# Patient Record
Sex: Male | Born: 1955 | Race: White | Hispanic: No | Marital: Single | Smoking: Former smoker
Health system: Southern US, Community
[De-identification: ages and names within clinical notes are randomized; demographics above are authoritative.]

## PROBLEM LIST (undated history)

## (undated) DIAGNOSIS — F419 Anxiety disorder, unspecified: Secondary | ICD-10-CM

## (undated) DIAGNOSIS — J869 Pyothorax without fistula: Secondary | ICD-10-CM

## (undated) HISTORY — PX: DENTAL SURGERY: SHX609

---

## 2000-04-15 ENCOUNTER — Encounter: Payer: Self-pay | Admitting: Emergency Medicine

## 2000-04-15 ENCOUNTER — Emergency Department (HOSPITAL_COMMUNITY): Admission: EM | Admit: 2000-04-15 | Discharge: 2000-04-15 | Payer: Self-pay | Admitting: Emergency Medicine

## 2011-11-02 ENCOUNTER — Encounter (HOSPITAL_COMMUNITY): Payer: Self-pay

## 2011-11-02 ENCOUNTER — Ambulatory Visit (INDEPENDENT_AMBULATORY_CARE_PROVIDER_SITE_OTHER): Payer: BC Managed Care – PPO | Admitting: Emergency Medicine

## 2011-11-02 ENCOUNTER — Emergency Department (HOSPITAL_COMMUNITY)
Admission: EM | Admit: 2011-11-02 | Discharge: 2011-11-02 | Disposition: A | Discharge status: Home / Self Care | Payer: BC Managed Care – PPO | Attending: Emergency Medicine | Admitting: Emergency Medicine

## 2011-11-02 VITALS — BP 157/90 | HR 87 | Temp 98.2°F | Resp 18 | Ht 69.5 in | Wt 182.8 lb

## 2011-11-02 DIAGNOSIS — F419 Anxiety disorder, unspecified: Secondary | ICD-10-CM

## 2011-11-02 DIAGNOSIS — Z87891 Personal history of nicotine dependence: Secondary | ICD-10-CM | POA: Insufficient documentation

## 2011-11-02 DIAGNOSIS — F411 Generalized anxiety disorder: Secondary | ICD-10-CM

## 2011-11-02 DIAGNOSIS — R079 Chest pain, unspecified: Secondary | ICD-10-CM

## 2011-11-02 MED ORDER — LORAZEPAM 1 MG PO TABS: 1.0000 mg | ORAL_TABLET | Freq: Three times a day (TID) | ORAL | Status: DC | PRN

## 2011-11-02 MED ORDER — ALPRAZOLAM 0.25 MG PO TABS
0.2500 mg | ORAL_TABLET | Freq: Once | ORAL | Status: AC
  Administered 2011-11-02: 0.25 mg via ORAL

## 2011-11-02 MED ORDER — ASPIRIN 81 MG PO CHEW: 324.0000 mg | CHEWABLE_TABLET | Freq: Once | ORAL | Status: DC

## 2011-11-02 NOTE — ED Provider Notes (Signed)
History     CSN: [id number]  Arrival date & time 11/02/11  1556   First MD Initiated Contact with Patient 11/02/11 1557      Chief Complaint  Patient presents with  . Chest Pain  . Anxiety    (Consider location/radiation/quality/duration/timing/severity/associated sxs/prior treatment) Patient is a 56 y.o. male presenting with chest pain. The history is provided by the patient and the EMS personnel.  Chest Pain The chest pain began more than 2 weeks ago. Chest pain occurs intermittently. The chest pain is resolved. The pain is associated with stress (anxiety). At its most intense, the pain is at 5/10. The pain is currently at 0/10. The severity of the pain is moderate. The quality of the pain is described as pressure-like. The pain does not radiate. Chest pain is worsened by stress. Pertinent negatives for primary symptoms include no fever, no fatigue, no syncope, no shortness of breath, no cough, no wheezing, no palpitations, no abdominal pain, no nausea, no vomiting, no dizziness and no altered mental status. Treatments tried: ativan. Risk factors include stress.     No past medical history on file.  No past surgical history on file.  No family history on file.  History  Substance Use Topics  . Smoking status: Former Games developer  . Smokeless tobacco: Not on file  . Alcohol Use: Not on file      Review of Systems  Constitutional: Negative for fever and fatigue.  Respiratory: Negative for cough, shortness of breath and wheezing.   Cardiovascular: Positive for chest pain. Negative for palpitations and syncope.  Gastrointestinal: Negative for nausea, vomiting, abdominal pain and diarrhea.  Neurological: Negative for dizziness and headaches.  Psychiatric/Behavioral: Negative for altered mental status.  All other systems reviewed and are negative.    Allergies  Review of patient's allergies indicates no known allergies.  Home Medications  No current outpatient prescriptions  on file.  BP 154/97  Pulse 66  Temp 97.9 F (36.6 C) (Oral)  Resp 18  SpO2 98%  Physical Exam  Nursing note and vitals reviewed. Constitutional: He is oriented to person, place, and time. He appears well-developed and well-nourished. No distress.  HENT:  Head: Normocephalic and atraumatic.  Eyes: Pupils are equal, round, and reactive to light.  Cardiovascular: Normal rate and normal heart sounds.   Pulmonary/Chest: Effort normal and breath sounds normal. No respiratory distress.  Abdominal: Soft. He exhibits no distension. There is no tenderness.  Musculoskeletal: Normal range of motion.  Neurological: He is alert and oriented to person, place, and time.  Skin: Skin is warm and dry.  Psychiatric: His mood appears anxious.    ED Course  Procedures (including critical care time)  Labs Reviewed - No data to display No results found.   Date: 11/02/2011  Rate: 70  Rhythm: normal sinus rhythm  QRS Axis: left  Intervals: normal  ST/T Wave abnormalities: normal  Conduction Disutrbances:none  Narrative Interpretation: NSR  Old EKG Reviewed: none available    1. Anxiety       MDM  4:06 PM Pt seen and examined. Pt sent from urgent care for subtle EKG changes associated with anxiety. Pt feels back to normal after ativan he was given there. Will repeat EKG as unable to see changes on this EKG. Feel that chest pain is more related to anxiety as it has now completely resolved. Pt also states that he has never had this pain during exertion in the last several weeks. Pt does have follow up tomorrow.  5:19 PM EKG here unremarkable. Pt continues to be chest pain free. Will discharge.        Daleen Bo, MD 11/03/11 0100

## 2011-11-02 NOTE — ED Notes (Signed)
To ED for eval of intermittent CP over the past cple weeks. Sent from [facility] for further eval. States he is going through a lot of stress and when anxious pain increases. 324mg  ASA given at [facility]

## 2011-11-02 NOTE — ED Notes (Signed)
Pt states he has had intermittent CP for 4 - 5 weeks.  He describes pain as starting in the left chest and "feels like it moves more left and then it goes away".  Pt states not always, but at times includes SOB and weakness.  [facility] stated EKG showed slight changes.

## 2011-11-02 NOTE — ED Provider Notes (Signed)
I saw and evaluated the patient, reviewed the resident's note and I agree with the findings and plan. 56 year old, male, presents emergency department complaining of intermittent left-sided chest pain.  That lasts 30 seconds to a minute.  He states that the symptoms occur when he is very anxious, as when he speaks on the telephone.  He states that he is in a lot of stress after he broke up with his girlfriend.  A few weeks ago.  He is asymptomatic now.  He was seen at an urgent care center, and there was a question about his EKG, so they sent him here for evaluation.  His evaluation in the emergency department is normal.  We will release him.  He already has an appointment for followup tomorrow.  Cheri Guppy, MD 11/02/11 [phone]

## 2011-11-02 NOTE — Progress Notes (Signed)
  Subjective:    Patient ID: Charles Dawson, male    DOB: Jun 21, 1955, 56 y.o.   MRN: [id number]  HPI approximately one month ago patient started having episodes of substernal chest discomfort associated with shortness of breath and diaphoresis. These spells all of short duration lasting only 30 seconds to a minute. Patient has been under a lot of stress recently over her breakup with his girlfriend. His wife died 2 years ago. He is nonsmoker but drinks 10-12 beers per day. He is on no medications at the present time    Review of Systems     Objective:   Physical Exam  Constitutional: He appears well-developed and well-nourished.  Eyes: Pupils are equal, round, and reactive to light.  Neck: No thyromegaly present.  Cardiovascular: Normal rate and regular rhythm.   Pulmonary/Chest: Effort normal and breath sounds normal.    EKG normal sinus rhythm versus 2 mm of ST depression at the J-point in 23 and aVF. There are minimal changes in the precordial      Assessment & Plan:  Has mild EKG changes. He has been under a large amount of stress recently. We'll go ahead and send him to the hospital for blood work he was given 0.25 Xanax to relax him a little bit and 4 baby aspirin. He was placed on O2 and on the monitor

## 2011-11-03 NOTE — ED Provider Notes (Signed)
I saw and evaluated the patient, reviewed the resident's note and I agree with the findings and plan.  Cheri Guppy, MD 11/03/11 [phone]

## 2011-11-05 ENCOUNTER — Other Ambulatory Visit: Payer: Self-pay | Admitting: Family Medicine

## 2011-11-05 ENCOUNTER — Ambulatory Visit
Admission: RE | Admit: 2011-11-05 | Discharge: 2011-11-05 | Disposition: A | Discharge status: Home / Self Care | Payer: BC Managed Care – PPO | Source: Ambulatory Visit | Attending: Family Medicine | Admitting: Family Medicine

## 2011-11-05 DIAGNOSIS — M545 Low back pain: Secondary | ICD-10-CM

## 2011-12-25 ENCOUNTER — Emergency Department (HOSPITAL_COMMUNITY): Payer: BC Managed Care – PPO

## 2011-12-25 ENCOUNTER — Encounter (HOSPITAL_COMMUNITY): Payer: Self-pay | Admitting: *Deleted

## 2011-12-25 ENCOUNTER — Inpatient Hospital Stay (HOSPITAL_COMMUNITY)
Admission: EM | Admit: 2011-12-25 | Discharge: 2012-01-02 | DRG: 539 | Disposition: A | Discharge status: Home / Self Care | Payer: BC Managed Care – PPO | Attending: Internal Medicine | Admitting: Internal Medicine

## 2011-12-25 DIAGNOSIS — D72829 Elevated white blood cell count, unspecified: Secondary | ICD-10-CM | POA: Diagnosis present

## 2011-12-25 DIAGNOSIS — J90 Pleural effusion, not elsewhere classified: Secondary | ICD-10-CM | POA: Diagnosis present

## 2011-12-25 DIAGNOSIS — J449 Chronic obstructive pulmonary disease, unspecified: Secondary | ICD-10-CM | POA: Diagnosis present

## 2011-12-25 DIAGNOSIS — R079 Chest pain, unspecified: Secondary | ICD-10-CM | POA: Diagnosis present

## 2011-12-25 DIAGNOSIS — J432 Centrilobular emphysema: Secondary | ICD-10-CM

## 2011-12-25 DIAGNOSIS — D649 Anemia, unspecified: Secondary | ICD-10-CM | POA: Diagnosis present

## 2011-12-25 DIAGNOSIS — F411 Generalized anxiety disorder: Secondary | ICD-10-CM | POA: Diagnosis present

## 2011-12-25 DIAGNOSIS — Z79899 Other long term (current) drug therapy: Secondary | ICD-10-CM

## 2011-12-25 DIAGNOSIS — J189 Pneumonia, unspecified organism: Secondary | ICD-10-CM | POA: Diagnosis present

## 2011-12-25 DIAGNOSIS — J4489 Other specified chronic obstructive pulmonary disease: Secondary | ICD-10-CM | POA: Diagnosis present

## 2011-12-25 DIAGNOSIS — M545 Low back pain, unspecified: Secondary | ICD-10-CM | POA: Diagnosis present

## 2011-12-25 DIAGNOSIS — G8929 Other chronic pain: Secondary | ICD-10-CM | POA: Diagnosis present

## 2011-12-25 DIAGNOSIS — J869 Pyothorax without fistula: Secondary | ICD-10-CM | POA: Diagnosis present

## 2011-12-25 DIAGNOSIS — Z87891 Personal history of nicotine dependence: Secondary | ICD-10-CM

## 2011-12-25 HISTORY — DX: Anxiety disorder, unspecified: F41.9

## 2011-12-25 HISTORY — DX: Pyothorax without fistula: J86.9

## 2011-12-25 LAB — CBC
HCT: 37.4 % — ABNORMAL LOW (ref 39.0–52.0)
Hemoglobin: 12.4 g/dL — ABNORMAL LOW (ref 13.0–17.0)
MCH: 32.7 pg (ref 26.0–34.0)
MCV: 98.7 fL (ref 78.0–100.0)
RBC: 3.79 MIL/uL — ABNORMAL LOW (ref 4.22–5.81)

## 2011-12-25 LAB — BASIC METABOLIC PANEL
CO2: 25 mEq/L (ref 19–32)
Calcium: 9.5 mg/dL (ref 8.4–10.5)
Chloride: 101 mEq/L (ref 96–112)
Glucose, Bld: 93 mg/dL (ref 70–99)
Potassium: 4.2 mEq/L (ref 3.5–5.1)
Sodium: 138 mEq/L (ref 135–145)

## 2011-12-25 LAB — CBC WITH DIFFERENTIAL/PLATELET
Eosinophils Relative: 0 % (ref 0–5)
Lymphocytes Relative: 6 % — ABNORMAL LOW (ref 12–46)
Lymphs Abs: 0.7 10*3/uL (ref 0.7–4.0)
MCV: 100.5 fL — ABNORMAL HIGH (ref 78.0–100.0)
Neutro Abs: 10.1 10*3/uL — ABNORMAL HIGH (ref 1.7–7.7)
Platelets: 303 10*3/uL (ref 150–400)
RBC: 3.86 MIL/uL — ABNORMAL LOW (ref 4.22–5.81)
WBC: 11.6 10*3/uL — ABNORMAL HIGH (ref 4.0–10.5)

## 2011-12-25 LAB — TROPONIN I
Troponin I: <0.3 ng/mL (ref <0.30)
Troponin I: <0.3 ng/mL (ref <0.30)
Troponin I: <0.3 ng/mL (ref <0.30)

## 2011-12-25 MED ORDER — HYDROMORPHONE HCL PF 1 MG/ML IJ SOLN
1.0000 mg | Freq: Once | INTRAMUSCULAR | Status: AC
  Administered 2011-12-25: 1 mg via INTRAVENOUS
  Filled 2011-12-25: qty 1

## 2011-12-25 MED ORDER — HYDROCODONE-ACETAMINOPHEN 10-325 MG PO TABS
1.0000 | ORAL_TABLET | Freq: Three times a day (TID) | ORAL | Status: DC
  Administered 2011-12-25 (×2): 1 via ORAL
  Filled 2011-12-25 (×2): qty 1

## 2011-12-25 MED ORDER — IBUPROFEN 400 MG PO TABS
600.0000 mg | ORAL_TABLET | Freq: Once | ORAL | Status: AC
  Administered 2011-12-25: 600 mg via ORAL
  Filled 2011-12-25: qty 2

## 2011-12-25 MED ORDER — IOHEXOL 350 MG/ML SOLN
100.0000 mL | Freq: Once | INTRAVENOUS | Status: AC | PRN
  Administered 2011-12-25: 100 mL via INTRAVENOUS

## 2011-12-25 MED ORDER — DEXTROSE 5 % IV SOLN
1.0000 g | Freq: Once | INTRAVENOUS | Status: DC
  Administered 2011-12-25: 1 g via INTRAVENOUS
  Filled 2011-12-25: qty 10

## 2011-12-25 MED ORDER — HYDROMORPHONE HCL PF 2 MG/ML IJ SOLN: 2.0000 mg | INTRAMUSCULAR | Status: DC | PRN

## 2011-12-25 MED ORDER — HYDROMORPHONE HCL PF 2 MG/ML IJ SOLN
2.0000 mg | Freq: Once | INTRAMUSCULAR | Status: AC
  Administered 2011-12-25: 2 mg via INTRAVENOUS
  Filled 2011-12-25: qty 1

## 2011-12-25 MED ORDER — ASPIRIN 81 MG PO CHEW
324.0000 mg | CHEWABLE_TABLET | Freq: Once | ORAL | Status: AC
  Administered 2011-12-25: 324 mg via ORAL
  Filled 2011-12-25: qty 1

## 2011-12-25 MED ORDER — ENOXAPARIN SODIUM 40 MG/0.4ML ~~LOC~~ SOLN
40.0000 mg | SUBCUTANEOUS | Status: DC
  Administered 2011-12-25 – 2011-12-26 (×2): 40 mg via SUBCUTANEOUS
  Filled 2011-12-25 (×3): qty 0.4

## 2011-12-25 MED ORDER — IBUPROFEN 800 MG PO TABS
800.0000 mg | ORAL_TABLET | Freq: Four times a day (QID) | ORAL | Status: DC
  Administered 2011-12-25 – 2011-12-26 (×3): 800 mg via ORAL
  Filled 2011-12-25 (×8): qty 1

## 2011-12-25 MED ORDER — IBUPROFEN 800 MG PO TABS: 800.0000 mg | ORAL_TABLET | Freq: Four times a day (QID) | ORAL | Status: DC

## 2011-12-25 MED ORDER — AZITHROMYCIN 250 MG PO TABS
500.0000 mg | ORAL_TABLET | Freq: Once | ORAL | Status: AC
  Administered 2011-12-25: 500 mg via ORAL
  Filled 2011-12-25: qty 2

## 2011-12-25 MED ORDER — HYDROMORPHONE HCL PF 1 MG/ML IJ SOLN: 1.0000 mg | Freq: Once | INTRAMUSCULAR | Status: DC

## 2011-12-25 MED ORDER — ONDANSETRON HCL 4 MG/2ML IJ SOLN
4.0000 mg | Freq: Once | INTRAMUSCULAR | Status: AC
  Administered 2011-12-25: 4 mg via INTRAVENOUS
  Filled 2011-12-25: qty 2

## 2011-12-25 MED ORDER — AZITHROMYCIN 500 MG PO TABS
500.0000 mg | ORAL_TABLET | ORAL | Status: DC
  Administered 2011-12-26 – 2011-12-28 (×2): 500 mg via ORAL
  Filled 2011-12-25 (×5): qty 1

## 2011-12-25 MED ORDER — ALPRAZOLAM 0.5 MG PO TABS
0.5000 mg | ORAL_TABLET | Freq: Three times a day (TID) | ORAL | Status: DC
  Administered 2011-12-25 (×2): 0.5 mg via ORAL
  Filled 2011-12-25 (×2): qty 1

## 2011-12-25 MED ORDER — DEXTROSE 5 % IV SOLN
1.0000 g | INTRAVENOUS | Status: DC
  Administered 2011-12-26: 1 g via INTRAVENOUS
  Filled 2011-12-25: qty 10

## 2011-12-25 MED ORDER — HYDROMORPHONE HCL PF 2 MG/ML IJ SOLN
1.0000 mg | INTRAMUSCULAR | Status: DC | PRN
  Administered 2011-12-25 – 2011-12-26 (×2): 2 mg via INTRAVENOUS
  Filled 2011-12-25 (×2): qty 1

## 2011-12-25 NOTE — ED Notes (Signed)
Pt returned to exam room. Requesting pain medication at the time. Wife at bedside. No signs of distress noted.

## 2011-12-25 NOTE — ED Provider Notes (Signed)
History     CSN: [id number]  Arrival date & time 12/25/11  4098   First MD Initiated Contact with Patient 12/25/11 0746      Chief Complaint  Patient presents with  . Fall    (Consider location/radiation/quality/duration/timing/severity/associated sxs/prior treatment) HPI Comments: Patient with a fall roughly one week ago however awoke acutely this morning with complaint of left lower posterior rib pain. Pain is worse with deep breathing. Patient states he feels mild shortness of breath. Pain described as sharp and nonradiating. No recent cough or hemoptysis. No fevers. Patient does not take any medications prior to arrival. Course is constant. Nothing makes symptoms better.  Patient is a 56 y.o. male presenting with fall. The history is provided by the patient.  Fall Pertinent negatives include no fever, no abdominal pain, no nausea, no vomiting and no headaches.    Past Medical History  Diagnosis Date  . Anxiety     Past Surgical History  Procedure Date  . Dental surgery     History reviewed. No pertinent family history.  History  Substance Use Topics  . Smoking status: Former Games developer  . Smokeless tobacco: Not on file  . Alcohol Use: 50.4 oz/week    84 Cans of beer per week      Review of Systems  Constitutional: Negative for fever.  HENT: Negative for sore throat and rhinorrhea.   Eyes: Negative for redness.  Respiratory: Positive for shortness of breath. Negative for cough and wheezing.   Cardiovascular: Positive for chest pain.  Gastrointestinal: Negative for nausea, vomiting, abdominal pain and diarrhea.  Genitourinary: Negative for dysuria.  Musculoskeletal: Negative for myalgias.  Skin: Negative for rash.  Neurological: Negative for headaches.    Allergies  Review of patient's allergies indicates no known allergies.  Home Medications   Current Outpatient Rx  Name  Route  Sig  Dispense  Refill  . ALPRAZOLAM 0.5 MG PO TABS   Oral   Take 0.5 mg  by mouth 3 (three) times daily.         . ASPIRIN 81 MG PO CHEW   Oral   Chew 324 mg by mouth once.         Marland Kitchen HYDROCODONE-ACETAMINOPHEN 10-325 MG PO TABS   Oral   Take 1 tablet by mouth 3 (three) times daily.           BP 119/74  Pulse 89  Temp 100.9 F (38.3 C) (Oral)  Resp 18  SpO2 94%  Physical Exam  Nursing note and vitals reviewed. Constitutional: He appears well-developed and well-nourished.  HENT:  Head: Normocephalic and atraumatic.  Eyes: Conjunctivae normal are normal. Right eye exhibits no discharge. Left eye exhibits no discharge.  Neck: Normal range of motion. Neck supple.  Cardiovascular: Normal rate, regular rhythm and normal heart sounds.   Pulmonary/Chest: Effort normal. No respiratory distress. He has decreased breath sounds in the left lower field. He has no wheezes. He has no rhonchi.    Abdominal: Soft. There is no tenderness.  Neurological: He is alert.  Skin: Skin is warm and dry.  Psychiatric: He has a normal mood and affect.    ED Course  Procedures (including critical care time)  Labs Reviewed  CBC WITH DIFFERENTIAL - Abnormal; Notable for the following:    WBC 11.6 (*)     RBC 3.86 (*)     Hemoglobin 12.4 (*)     HCT 38.8 (*)     MCV 100.5 (*)  Neutrophils Relative 88 (*)     Neutro Abs 10.1 (*)     Lymphocytes Relative 6 (*)     All other components within normal limits  BASIC METABOLIC PANEL  TROPONIN I   Dg Chest 2 View  12/25/2011  *RADIOLOGY REPORT*  Clinical Data: Left posterior rib pain of concern for pneumothorax. Recent fall.  CHEST - 2 VIEW  Comparison: None.  Findings: Stable cardiac silhouette.  There is bibasilar atelectasis versus air space disease.  There is elevation of the left hemidiaphragm.  No pneumothorax.  No evidence of rib fracture.  IMPRESSION:  1.  Bibasilar atelectasis versus infiltrates. 2.  Elevation left hemidiaphragm. 3.  No evidence of  pneumothorax. 4.  No rib fracture evident.   Original  Report Authenticated By: Genevive Bi, M.D.    Ct Angio Chest W/cm &/or Wo Cm  12/25/2011  *RADIOLOGY REPORT*  Clinical Data: Post fall 1 week ago, now with left-sided rib pain, cough, shortness of breath, fever  CT ANGIOGRAPHY CHEST  Technique:  Multidetector CT imaging of the chest using the standard protocol during bolus administration of intravenous contrast. Multiplanar reconstructed images including MIPs were obtained and reviewed to evaluate the vascular anatomy.  Contrast: OMNIPAQUE IOHEXOL 350 MG/ML SOLN  Comparison: Chest radiograph - earlier same day  Vascular Findings:  Suboptimal opacification of the pulmonary arterial system with the main pulmonary artery measuring only 200 HU.  Given this limitation, there are no discrete filling defects within the pulmonary arterial tree to the level of the segmental branches. Evaluation of the distal subsegmental pulmonary arteries is degraded secondary to suboptimal vessel opacification and patient motion artifact.  Borderline enlargement of the main pulmonary artery measuring 3.4 cm in greatest transverse axial dimension (image 124, series six).  Normal heart size.  Coronary artery calcifications.  No pericardial effusion. Normal caliber of the thoracic aorta.  Conventional configuration of the aortic arch.  A minimal amount of pleural fluid layers adjacent to the descending thoracic aorta.  No periaortic stranding or evidence of thoracic aortic dissection.  Nonvascular findings:  Evaluation of the pulmonary parenchyma is minimally degraded secondary to patient respiratory artifact.  Small left-sided pleural effusion with left basilar heterogeneous air space opacities with scattered air bronchograms, worrisome for infection.  Heterogeneous opacities, some of which appear to have a tree in bud configuration within the right lower lung (images 72 and 77, series five) are also worrisome for infection.  Centrilobular emphysema.  No pneumothorax.  Central  airways are patent.  Scattered shoddy mediastinal lymph nodes are not enlarged by CT criteria.  No axillary adenopathy.  Incidental imaging of the upper abdomen is normal.  No acute or aggressive osseous abnormalities, specifically, no displaced left-sided rib fractures.  IMPRESSION:  1.  No definite evidence of pulmonary embolism to the level of the bilateral subsegmental pulmonary arteries. 2.  Small left-sided effusion with bibasilar heterogeneous airspace opacities, left greater than right, worrisome for multifocal infection, less likely aspiration.  A follow-up chest radiograph in 4 to 6 weeks after treatment is recommended to ensure resolution.  3.  Mild centrilobular emphysematous change. 4.  Coronary artery calcifications. Borderline enlargement of the main pulmonary artery.  Further evaluation with cardiac echo may be performed as clinically indicated. 5.  No acute osseous abnormalities, specifically, no displaced left- sided rib fractures.   Original Report Authenticated By: Tacey Ruiz, MD      1. CAP (community acquired pneumonia)   2. Pleural effusion     8:00  AM Patient seen and examined. Medications ordered. CXR ordered to eval for pneumothorax, pneumonia.   Vital signs reviewed and are as follows: Filed Vitals:   12/25/11 0738  BP: 119/74  Pulse: 89  Temp: 100.9 F (38.3 C)  Resp: 18   9:14 AM Pt d/w and seen by Dr. Patria Mane. CT, labs ordered given amount of pain without definite cause on CXR. Will monitor.    Date: 12/25/2011  Rate: 96  Rhythm: normal sinus rhythm  QRS Axis: normal  Intervals: normal  ST/T Wave abnormalities: normal  Conduction Disutrbances:none  Narrative Interpretation:   Old EKG Reviewed: unchanged from 11/02/2011  11:14 AM Effusion and probable pneumonia on CT scan. No PE. Patient continues to have pain. Will re\re dose pain medication. Antibiotics ordered. Will move to CDU. Plan: If pain is persistent, will need admission to hospital.  Handoff to  [facility].   MDM  Back pain, patient with community acquired pneumonia complicated by effusion. No loculation or empyema noted. Will need admission if pain is not controlled.        Renne Crigler, [place] 12/25/11 1226

## 2011-12-25 NOTE — ED Notes (Signed)
Patient fell about a week ago and now he is in pain.  Pain is located on his left side rib.  Hurts when he takes a deep breath

## 2011-12-25 NOTE — ED Notes (Signed)
Attempted to give report to RN on 5500, Diplomatic Services operational officer on 5500 reports RN is unavailable at this time.

## 2011-12-25 NOTE — ED Notes (Signed)
Pt c/o left lower rib/lung pain, pt reports pain is worse with breathing.
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
## 2011-12-25 NOTE — ED Notes (Signed)
Patient transported to X-ray 

## 2011-12-25 NOTE — ED Notes (Signed)
Provider at bedside
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
## 2011-12-25 NOTE — ED Notes (Signed)
Dondra Spry [facility]'s phone # [phone] (home), [phone] (cell)

## 2011-12-25 NOTE — Progress Notes (Signed)
ANTIBIOTIC CONSULT NOTE - INITIAL  Pharmacy Consult for Antibiotics renal dose adjustment (azithromycin and rocephin) Indication: pneumonia  No Known Allergies  Patient Measurements: Height: 5\' 10"  (177.8 cm) Weight: 188 lb 4.4 oz (85.4 kg) IBW/kg (Calculated) : 73   Vital Signs: Temp: 98.3 F (36.8 C) (11/28 1512) Temp src: Oral (11/28 1512) BP: 107/65 mmHg (11/28 1512) Pulse Rate: 82  (11/28 1512) Intake/Output from previous day:   Intake/Output from this shift:    Labs:  [facility] 12/25/11 0907  WBC 11.6*  HGB 12.4*  PLT 303  LABCREA --  CREATININE 0.77   Estimated Creatinine Clearance: 103.9 ml/min (by C-G formula based on Cr of 0.77). No results found for this basename: VANCOTROUGH:2,VANCOPEAK:2,VANCORANDOM:2,GENTTROUGH:2,GENTPEAK:2,GENTRANDOM:2,TOBRATROUGH:2,TOBRAPEAK:2,TOBRARND:2,AMIKACINPEAK:2,AMIKACINTROU:2,AMIKACIN:2, in the last 72 hours   Microbiology: No results found for this or any previous visit (from the past 720 hour(s)).  Medical History: Past Medical History  Diagnosis Date  . Anxiety    Assessment: Charles Dawson admitted for CAP, started on azithromycin 500mg  po daily and rocephin 1g IV Q 24hrs. Pharmacy is consulted for Antibiotics renal dose adjustment. Pt. Renal function is good with est. crcl > 100 ml/min. Both the two antibiotics doesn't renal dose adjustment.   Goal of Therapy:  Clear of infection  Plan:  - Continue with current regimen.  - Pharmacy will sign off - Please re-consult if other antibiotics dosing is needed.  Thanks.  Bayard Hugger, PharmD, BCPS  Clinical Pharmacist  Pager: [phone]   12/25/2011,3:32 PM

## 2011-12-25 NOTE — ED Provider Notes (Signed)
Medical screening examination/treatment/procedure(s) were conducted as a shared visit with non-physician practitioner(s) and myself.  I personally evaluated the patient during the encounter  Given the patient's worsening pain a CT angiogram was obtained which demonstrates evidence of bibasilar pneumonia.  The patient be started on IV antibiotics.  Admission for treatment of his pneumonia and pain control.  I personally reviewed the imaging tests through PACS system I reviewed available ER/hospitalization records through the EMR 1. CAP (community acquired pneumonia)   2. Pleural effusion    Dg Chest 2 View  12/25/2011  *RADIOLOGY REPORT*  Clinical Data: Left posterior rib pain of concern for pneumothorax. Recent fall.  CHEST - 2 VIEW  Comparison: None.  Findings: Stable cardiac silhouette.  There is bibasilar atelectasis versus air space disease.  There is elevation of the left hemidiaphragm.  No pneumothorax.  No evidence of rib fracture.  IMPRESSION:  1.  Bibasilar atelectasis versus infiltrates. 2.  Elevation left hemidiaphragm. 3.  No evidence of  pneumothorax. 4.  No rib fracture evident.   Original Report Authenticated By: Genevive Bi, M.D.    Ct Angio Chest W/cm &/or Wo Cm  12/25/2011  *RADIOLOGY REPORT*  Clinical Data: Post fall 1 week ago, now with left-sided rib pain, cough, shortness of breath, fever  CT ANGIOGRAPHY CHEST  Technique:  Multidetector CT imaging of the chest using the standard protocol during bolus administration of intravenous contrast. Multiplanar reconstructed images including MIPs were obtained and reviewed to evaluate the vascular anatomy.  Contrast: OMNIPAQUE IOHEXOL 350 MG/ML SOLN  Comparison: Chest radiograph - earlier same day  Vascular Findings:  Suboptimal opacification of the pulmonary arterial system with the main pulmonary artery measuring only 200 HU.  Given this limitation, there are no discrete filling defects within the pulmonary arterial tree to the  level of the segmental branches. Evaluation of the distal subsegmental pulmonary arteries is degraded secondary to suboptimal vessel opacification and patient motion artifact.  Borderline enlargement of the main pulmonary artery measuring 3.4 cm in greatest transverse axial dimension (image 124, series six).  Normal heart size.  Coronary artery calcifications.  No pericardial effusion. Normal caliber of the thoracic aorta.  Conventional configuration of the aortic arch.  A minimal amount of pleural fluid layers adjacent to the descending thoracic aorta.  No periaortic stranding or evidence of thoracic aortic dissection.  Nonvascular findings:  Evaluation of the pulmonary parenchyma is minimally degraded secondary to patient respiratory artifact.  Small left-sided pleural effusion with left basilar heterogeneous air space opacities with scattered air bronchograms, worrisome for infection.  Heterogeneous opacities, some of which appear to have a tree in bud configuration within the right lower lung (images 72 and 77, series five) are also worrisome for infection.  Centrilobular emphysema.  No pneumothorax.  Central airways are patent.  Scattered shoddy mediastinal lymph nodes are not enlarged by CT criteria.  No axillary adenopathy.  Incidental imaging of the upper abdomen is normal.  No acute or aggressive osseous abnormalities, specifically, no displaced left-sided rib fractures.  IMPRESSION:  1.  No definite evidence of pulmonary embolism to the level of the bilateral subsegmental pulmonary arteries. 2.  Small left-sided effusion with bibasilar heterogeneous airspace opacities, left greater than right, worrisome for multifocal infection, less likely aspiration.  A follow-up chest radiograph in 4 to 6 weeks after treatment is recommended to ensure resolution.  3.  Mild centrilobular emphysematous change. 4.  Coronary artery calcifications. Borderline enlargement of the main pulmonary artery.  Further evaluation with  cardiac  echo may be performed as clinically indicated. 5.  No acute osseous abnormalities, specifically, no displaced left- sided rib fractures.   Original Report Authenticated By: Tacey Ruiz, MD     Lyanne Co, MD 12/25/11 1249

## 2011-12-26 ENCOUNTER — Inpatient Hospital Stay (HOSPITAL_COMMUNITY): Payer: BC Managed Care – PPO

## 2011-12-26 DIAGNOSIS — J438 Other emphysema: Secondary | ICD-10-CM

## 2011-12-26 DIAGNOSIS — J189 Pneumonia, unspecified organism: Secondary | ICD-10-CM

## 2011-12-26 DIAGNOSIS — J90 Pleural effusion, not elsewhere classified: Secondary | ICD-10-CM

## 2011-12-26 DIAGNOSIS — R079 Chest pain, unspecified: Secondary | ICD-10-CM

## 2011-12-26 DIAGNOSIS — J869 Pyothorax without fistula: Secondary | ICD-10-CM

## 2011-12-26 LAB — CBC WITH DIFFERENTIAL/PLATELET
Basophils Relative: 0 % (ref 0–1)
Eosinophils Absolute: 0 10*3/uL (ref 0.0–0.7)
Eosinophils Absolute: 0 10*3/uL (ref 0.0–0.7)
Eosinophils Relative: 0 % (ref 0–5)
Hemoglobin: 11.3 g/dL — ABNORMAL LOW (ref 13.0–17.0)
Lymphocytes Relative: 7 % — ABNORMAL LOW (ref 12–46)
Lymphs Abs: 1 10*3/uL (ref 0.7–4.0)
Lymphs Abs: 0.7 10*3/uL (ref 0.7–4.0)
MCH: 32.6 pg (ref 26.0–34.0)
MCH: 31.5 pg (ref 26.0–34.0)
MCHC: 32.2 g/dL (ref 30.0–36.0)
MCV: 99.1 fL (ref 78.0–100.0)
MCV: 98 fL (ref 78.0–100.0)
Monocytes Relative: 6 % (ref 3–12)
Monocytes Relative: 5 % (ref 3–12)
Neutrophils Relative %: 87 % — ABNORMAL HIGH (ref 43–77)
Platelets: 297 10*3/uL (ref 150–400)
RBC: 3.47 MIL/uL — ABNORMAL LOW (ref 4.22–5.81)
RBC: 3.52 MIL/uL — ABNORMAL LOW (ref 4.22–5.81)

## 2011-12-26 LAB — MAGNESIUM: Magnesium: 1.6 mg/dL (ref 1.5–2.5)

## 2011-12-26 LAB — MRSA PCR SCREENING: MRSA by PCR: NEGATIVE

## 2011-12-26 LAB — BLOOD GAS, ARTERIAL
Acid-base deficit: 0.7 mmol/L (ref 0.0–2.0)
Drawn by: 252031
FIO2: 0.21 %
O2 Saturation: 92.4 %
Patient temperature: 98.6
pO2, Arterial: 60.8 mmHg — ABNORMAL LOW (ref 80.0–100.0)

## 2011-12-26 LAB — URINALYSIS, ROUTINE W REFLEX MICROSCOPIC
Bilirubin Urine: NEGATIVE
Glucose, UA: NEGATIVE mg/dL
Ketones, ur: 15 mg/dL — AB
Leukocytes, UA: NEGATIVE
pH: 6.5 (ref 5.0–8.0)

## 2011-12-26 LAB — BASIC METABOLIC PANEL
CO2: 24 mEq/L (ref 19–32)
Glucose, Bld: 125 mg/dL — ABNORMAL HIGH (ref 70–99)
Potassium: 3.3 mEq/L — ABNORMAL LOW (ref 3.5–5.1)
Sodium: 133 mEq/L — ABNORMAL LOW (ref 135–145)

## 2011-12-26 LAB — LIPASE, BLOOD: Lipase: 16 U/L (ref 11–59)

## 2011-12-26 LAB — URINE MICROSCOPIC-ADD ON

## 2011-12-26 MED ORDER — PIPERACILLIN-TAZOBACTAM 3.375 G IVPB 30 MIN
3.3750 g | Freq: Three times a day (TID) | INTRAVENOUS | Status: DC
  Administered 2011-12-26 – 2011-12-27 (×2): 3.375 g via INTRAVENOUS
  Filled 2011-12-26 (×6): qty 50

## 2011-12-26 MED ORDER — VITAMIN B-1 100 MG PO TABS
100.0000 mg | ORAL_TABLET | Freq: Every day | ORAL | Status: DC
  Administered 2011-12-26 – 2012-01-02 (×7): 100 mg via ORAL
  Filled 2011-12-26 (×8): qty 1

## 2011-12-26 MED ORDER — LORAZEPAM 1 MG PO TABS
1.0000 mg | ORAL_TABLET | Freq: Four times a day (QID) | ORAL | Status: DC | PRN
  Administered 2011-12-26: 1 mg via ORAL
  Filled 2011-12-26: qty 1

## 2011-12-26 MED ORDER — KETOROLAC TROMETHAMINE 30 MG/ML IJ SOLN: 30.0000 mg | Freq: Three times a day (TID) | INTRAMUSCULAR | Status: DC

## 2011-12-26 MED ORDER — POTASSIUM CHLORIDE CRYS ER 20 MEQ PO TBCR
40.0000 meq | EXTENDED_RELEASE_TABLET | Freq: Once | ORAL | Status: AC
  Administered 2011-12-26: 40 meq via ORAL
  Filled 2011-12-26: qty 1

## 2011-12-26 MED ORDER — VANCOMYCIN HCL IN DEXTROSE 1-5 GM/200ML-% IV SOLN
1000.0000 mg | INTRAVENOUS | Status: AC
  Administered 2011-12-27 (×2): 1000 mg via INTRAVENOUS
  Filled 2011-12-26: qty 200

## 2011-12-26 MED ORDER — ONDANSETRON HCL 4 MG/2ML IJ SOLN: 4.0000 mg | Freq: Four times a day (QID) | INTRAMUSCULAR | Status: DC | PRN

## 2011-12-26 MED ORDER — KETOROLAC TROMETHAMINE 30 MG/ML IJ SOLN
30.0000 mg | Freq: Three times a day (TID) | INTRAMUSCULAR | Status: AC
  Administered 2011-12-26 – 2011-12-27 (×4): 30 mg via INTRAVENOUS
  Filled 2011-12-26 (×7): qty 1

## 2011-12-26 MED ORDER — HYDROCODONE-ACETAMINOPHEN 10-325 MG PO TABS
1.0000 | ORAL_TABLET | Freq: Three times a day (TID) | ORAL | Status: DC | PRN
  Administered 2011-12-26 (×2): 1 via ORAL
  Filled 2011-12-26 (×2): qty 1

## 2011-12-26 MED ORDER — PANTOPRAZOLE SODIUM 40 MG PO TBEC
40.0000 mg | DELAYED_RELEASE_TABLET | Freq: Every day | ORAL | Status: DC
  Administered 2011-12-28 – 2012-01-02 (×6): 40 mg via ORAL
  Filled 2011-12-26 (×5): qty 1

## 2011-12-26 MED ORDER — THIAMINE HCL 100 MG/ML IJ SOLN
100.0000 mg | Freq: Every day | INTRAMUSCULAR | Status: DC
  Filled 2011-12-26 (×4): qty 1

## 2011-12-26 MED ORDER — LORAZEPAM 2 MG/ML IJ SOLN
1.0000 mg | Freq: Four times a day (QID) | INTRAMUSCULAR | Status: DC | PRN
  Administered 2011-12-27: 1 mg via INTRAVENOUS

## 2011-12-26 MED ORDER — ADULT MULTIVITAMIN W/MINERALS CH
1.0000 | ORAL_TABLET | Freq: Every day | ORAL | Status: DC
  Administered 2011-12-26 – 2012-01-02 (×7): 1 via ORAL
  Filled 2011-12-26 (×8): qty 1

## 2011-12-26 MED ORDER — IOHEXOL 300 MG/ML  SOLN
80.0000 mL | Freq: Once | INTRAMUSCULAR | Status: AC | PRN
  Administered 2011-12-26: 80 mL via INTRAVENOUS

## 2011-12-26 MED ORDER — ASPIRIN EC 81 MG PO TBEC: 81.0000 mg | DELAYED_RELEASE_TABLET | Freq: Once | ORAL | Status: DC

## 2011-12-26 MED ORDER — ALPRAZOLAM 0.5 MG PO TABS
0.5000 mg | ORAL_TABLET | Freq: Three times a day (TID) | ORAL | Status: DC | PRN
  Administered 2011-12-27 – 2012-01-01 (×6): 0.5 mg via ORAL
  Filled 2011-12-26 (×8): qty 1

## 2011-12-26 MED ORDER — FOLIC ACID 1 MG PO TABS
1.0000 mg | ORAL_TABLET | Freq: Every day | ORAL | Status: DC
  Administered 2011-12-26 – 2012-01-02 (×7): 1 mg via ORAL
  Filled 2011-12-26 (×9): qty 1

## 2011-12-26 MED ORDER — HYDROMORPHONE HCL PF 1 MG/ML IJ SOLN
1.0000 mg | INTRAMUSCULAR | Status: DC | PRN
  Administered 2011-12-26 (×2): 1 mg via INTRAVENOUS
  Administered 2011-12-26 (×2): 2 mg via INTRAVENOUS
  Administered 2011-12-26 – 2011-12-27 (×4): 1 mg via INTRAVENOUS
  Administered 2011-12-27: 2 mg via INTRAVENOUS
  Filled 2011-12-26 (×3): qty 1
  Filled 2011-12-26 (×3): qty 2
  Filled 2011-12-26: qty 1
  Filled 2011-12-26: qty 2

## 2011-12-26 MED ORDER — PANTOPRAZOLE SODIUM 40 MG IV SOLR
40.0000 mg | INTRAVENOUS | Status: DC
  Administered 2011-12-26: 40 mg via INTRAVENOUS
  Filled 2011-12-26 (×3): qty 40

## 2011-12-26 NOTE — Progress Notes (Addendum)
S: Patient is complaining of pain that is 10/10 worse with inspiration. The pain starts at the apex of the heart and radiates to his left side and back.  He states that he drinks 6 pack everyday for 5 days of the week. His last drink was on Wednesday, he split a pitcher of beer with a friend. He states that he fell off the cough tree weeks ago and injured his left side.   O: Vitals: 101.82F, BP 122/72, 98, 92% on RA Gen: Pt sitting in chair, moaning, moving his torso back and forth with pain, stops talking when he takes a deep breath secondary to pain.  HEENT: no scleral icterus Pulm: LLL soft crackles, right clear to ausculation, no wheezing.  Cardiac: tachycardic, no murmur,  Abdomen: Non tender, non distended, bowel sounds present in all 4 quadrants,  MSK: He has pain with deep palpation on his left side and flank, the pain is worse with movement, Neurology: Alert and oriented x3, bilateral coarse tremor of hands  A&P: Chest pain: Described as pleuritic chest pain, worse with inspiration, concern for empyema v. Pericarditis as seen on CT angio of chest. This could also be ACS, however, troponin is negative x3.  -Ordered EKG, it shows diffuse T wave flattening -Troponin x3, stat -O2 tx, keep O2 >92% -PT/INR,  -Consult to PCCM for possible thoracentesis.  -NPO for possible thoracentesis  Fever: Pt was afebrile prior to today. Likely secondary to LLL effusion and possible empyema per above. WBC was 17 on admission but trended down to 14.3 today.  -blood culture x2 -Added Zosyn, cont Ceftriaxone and Azithromycin -urine culture -vitals q4hour  Alcohol abuse: Pt reports drinking a 6 pack of beer every day, last drink was a pitcher of beer on Wednesday.  -CIWA protocol -Mg, phosphorus -lipase -Repleted K with PO
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
## 2011-12-26 NOTE — Consult Note (Addendum)
PULMONARY  / CRITICAL CARE MEDICINE  Name: Charles Dawson MRN: [id number] DOB: 12-30-1955    LOS: 1  REFERRING PROVIDER:  IMTS  CHIEF COMPLAINT:  Pneumonia and pleural effusion.  BRIEF PATIENT DESCRIPTION: 56 year old male with no significant medical history who presents to the hospital one week after a fall on his left side initially without any pain.  On 11/28 however he started developing pain with inspiration and positional.  He also reports dry cough and SOB.  No hemoptysis, fever but only back pain.  LINES / TUBES: PIV  CULTURES: Blood 11/28>>> Urine 11/28>>> Sputum 11/28>>>  ANTIBIOTICS: Zosyn 11/28>>> Zithromax  PAST MEDICAL HISTORY :  Past Medical History  Diagnosis Date  . Anxiety    Past Surgical History  Procedure Date  . Dental surgery    Prior to Admission medications   Medication Sig Start Date End Date Taking? Authorizing Provider  ALPRAZolam Prudy Feeler) 0.5 MG tablet Take 0.5 mg by mouth 3 (three) times daily.   Yes Historical Provider, MD  aspirin 81 MG chewable tablet Chew 324 mg by mouth once.   Yes Historical Provider, MD  HYDROcodone-acetaminophen (NORCO) 10-325 MG per tablet Take 1 tablet by mouth 3 (three) times daily.   Yes Historical Provider, MD   No Known Allergies  FAMILY HISTORY:  History reviewed. No pertinent family history. SOCIAL HISTORY:  reports that he has quit smoking. He does not have any smokeless tobacco history on file. He reports that he drinks about 50.4 ounces of alcohol per week. He reports that he uses illicit drugs.  REVIEW OF SYSTEMS:   Constitutional: Negative for fever, chills, weight loss, malaise/fatigue and diaphoresis.  HENT: Negative for hearing loss, ear pain, nosebleeds, congestion, sore throat, neck pain, tinnitus and ear discharge.   Eyes: Negative for blurred vision, double vision, photophobia, pain, discharge and redness.  Respiratory: Negative for cough, hemoptysis, sputum production, shortness of breath,  wheezing and stridor.   Cardiovascular: Negative for chest pain, palpitations, orthopnea, claudication, leg swelling and PND.  Gastrointestinal: Negative for heartburn, nausea, vomiting, abdominal pain, diarrhea, constipation, blood in stool and melena.  Genitourinary: Negative for dysuria, urgency, frequency, hematuria and flank pain.  Musculoskeletal: Negative for myalgias, back pain, joint pain and falls.  Skin: Negative for itching and rash.  Neurological: Negative for dizziness, tingling, tremors, sensory change, speech change, focal weakness, seizures, loss of consciousness, weakness and headaches.  Endo/Heme/Allergies: Negative for environmental allergies and polydipsia. Does not bruise/bleed easily.  INTERVAL HISTORY:   VITAL SIGNS: Temp:  [97.9 F (36.6 C)-99.1 F (37.3 C)] 97.9 F (36.6 C) (11/29 0508) Pulse Rate:  [66-82] 66  (11/29 0708) Resp:  [20-22] 20  (11/29 0708) BP: (96-157)/(53-129) 96/60 mmHg (11/29 0708) SpO2:  [95 %-99 %] 98 % (11/29 0508) Weight:  [85.4 kg (188 lb 4.4 oz)] 85.4 kg (188 lb 4.4 oz) (11/28 1512)  PHYSICAL EXAMINATION: General:  Well appearing male, in obvious painful distress. Neuro:  Alert, oriented and interactive, moves all ext to command. HEENT:  [place]/AT, PERRL, EOM-I and MMM. Neck:  Supple, -LAN and -thyromegally. Cardiovascular:  RRR, Nl S1/S2, -M/R/G. Lungs:  Sig diminished BS on the left, CTA on the right. Abdomen:  Soft, NT, ND and +BS. Musculoskeletal:  -edema and -tenderness. Skin:  Intact.   Lab 12/26/11 0610 12/25/11 1603 12/25/11 0907  NA 133* -- 138  K 3.3* -- 4.2  CL 98 -- 101  CO2 24 -- 25  BUN 13 -- 6  CREATININE 0.72 0.81 0.77  GLUCOSE 125* -- 93    Lab 12/26/11 1410 12/26/11 0610 12/25/11 1603  HGB 11.1* 11.3* 12.4*  HCT 34.5* 34.4* 37.4*  WBC 18.3* 14.3* 17.0*  PLT 297 282 304   Dg Chest 2 View  12/26/2011  *RADIOLOGY REPORT*  Clinical Data: Chest pain and cough.  CHEST - 2 VIEW  Comparison: 12/25/2011  radiographs and CT.  Findings: Left pleural effusion has enlarged, now moderate in size. There is associated increased air space disease in the left lower lobe.  Patchy right basilar opacity appears unchanged.  There is no significant right-sided pleural effusion.  Visualized heart size and mediastinal contours are stable.  IMPRESSION:  1.  Enlarging left pleural effusion. 2.  Worsening left lower lobe air space disease suspicious for pneumonia as correlated with recent CT.   Original Report Authenticated By: Carey Bullocks, M.D.    Dg Chest 2 View  12/25/2011  *RADIOLOGY REPORT*  Clinical Data: Left posterior rib pain of concern for pneumothorax. Recent fall.  CHEST - 2 VIEW  Comparison: None.  Findings: Stable cardiac silhouette.  There is bibasilar atelectasis versus air space disease.  There is elevation of the left hemidiaphragm.  No pneumothorax.  No evidence of rib fracture.  IMPRESSION:  1.  Bibasilar atelectasis versus infiltrates. 2.  Elevation left hemidiaphragm. 3.  No evidence of  pneumothorax. 4.  No rib fracture evident.   Original Report Authenticated By: Genevive Bi, M.D.    Ct Angio Chest W/cm &/or Wo Cm  12/25/2011  *RADIOLOGY REPORT*  Clinical Data: Post fall 1 week ago, now with left-sided rib pain, cough, shortness of breath, fever  CT ANGIOGRAPHY CHEST  Technique:  Multidetector CT imaging of the chest using the standard protocol during bolus administration of intravenous contrast. Multiplanar reconstructed images including MIPs were obtained and reviewed to evaluate the vascular anatomy.  Contrast: OMNIPAQUE IOHEXOL 350 MG/ML SOLN  Comparison: Chest radiograph - earlier same day  Vascular Findings:  Suboptimal opacification of the pulmonary arterial system with the main pulmonary artery measuring only 200 HU.  Given this limitation, there are no discrete filling defects within the pulmonary arterial tree to the level of the segmental branches. Evaluation of the distal  subsegmental pulmonary arteries is degraded secondary to suboptimal vessel opacification and patient motion artifact.  Borderline enlargement of the main pulmonary artery measuring 3.4 cm in greatest transverse axial dimension (image 124, series six).  Normal heart size.  Coronary artery calcifications.  No pericardial effusion. Normal caliber of the thoracic aorta.  Conventional configuration of the aortic arch.  A minimal amount of pleural fluid layers adjacent to the descending thoracic aorta.  No periaortic stranding or evidence of thoracic aortic dissection.  Nonvascular findings:  Evaluation of the pulmonary parenchyma is minimally degraded secondary to patient respiratory artifact.  Small left-sided pleural effusion with left basilar heterogeneous air space opacities with scattered air bronchograms, worrisome for infection.  Heterogeneous opacities, some of which appear to have a tree in bud configuration within the right lower lung (images 72 and 77, series five) are also worrisome for infection.  Centrilobular emphysema.  No pneumothorax.  Central airways are patent.  Scattered shoddy mediastinal lymph nodes are not enlarged by CT criteria.  No axillary adenopathy.  Incidental imaging of the upper abdomen is normal.  No acute or aggressive osseous abnormalities, specifically, no displaced left-sided rib fractures.  IMPRESSION:  1.  No definite evidence of pulmonary embolism to the level of the bilateral subsegmental pulmonary arteries. 2.  Small  left-sided effusion with bibasilar heterogeneous airspace opacities, left greater than right, worrisome for multifocal infection, less likely aspiration.  A follow-up chest radiograph in 4 to 6 weeks after treatment is recommended to ensure resolution.  3.  Mild centrilobular emphysematous change. 4.  Coronary artery calcifications. Borderline enlargement of the main pulmonary artery.  Further evaluation with cardiac echo may be performed as clinically indicated. 5.   No acute osseous abnormalities, specifically, no displaced left- sided rib fractures.   Original Report Authenticated By: Tacey Ruiz, MD     ASSESSMENT / PLAN:  56 year old s/p fall with left sided chest wall pain.  Suspect this is either a hemothorax, hemothorax that got infected or empyema.  Bedside U/S showed significant loculations and septation in the fluid.  Non-contrast CT of the chest showed air-bronchograms with ateletasis and septated effusion.  This is not a situation where a thoracentesis will be the answer.  The patient will need to be seen by thoracic surgery for a VATS and evacuation of the pleural space.  PCCM will be available on a PRN bases if help is needed post op please call back.  Will sign off for now.  This was relayed to the on call resident.  Alyson Reedy, M.D. [facility] Pulmonary/Critical Care Medicine. Pager: [phone]. After hours pager: [phone].

## 2011-12-26 NOTE — H&P (Signed)
Internal Medicine teaching Service Attending Dr.Marrah Vanevery. I have personally examined the patient and reviewed the h and P documented by the Resident. In brief  Chief complaint: Left sided pain and ongoing cough. HOPI: 56 year old male with left sided pain and ongoing cough with exposure to smoke. 9 point review of system as documented in the Resident note. Social history admitting medication family history past surgical history allergies reviewed. Physical examination Notable for: positive for cough and pain on inspiration. Labs are significant for : Increased WBCs.  Imaging is significant for: bilateral multi lobar pneumonia. EKG: Normal sinus Rhythm A and P: CAP: Continue Antibiotics and supportive management. Check for Influenza. CTM if pain continues will have to consider pleural tap to discern the nature of the fluid. Does not have any factors for aspiraiton currently. But does have poor dentition.

## 2011-12-26 NOTE — Care Management Note (Signed)
    Page 1 of 1   01/02/2012     4:37:37 PM   CARE MANAGEMENT NOTE 01/02/2012  Patient:  Charles Dawson, Charles Dawson   Account Number:  [id number]  Date Initiated:  12/26/2011  Documentation initiated by:  Letha Cape  Subjective/Objective Assessment:   dx pna  admit- lives alone     Action/Plan:   S/p VATS 11/30 -   Anticipated DC Date:  01/02/2012   Anticipated DC Plan:  HOME/SELF CARE      DC Planning Services  CM consult      Choice offered to / List presented to:             Status of service:  Completed, signed off Medicare Important Message given?   (If response is "NO", the following Medicare IM given date fields will be blank) Date Medicare IM given:   Date Additional Medicare IM given:    Discharge Disposition:  HOME/SELF CARE  Per UR Regulation:  Reviewed for med. necessity/level of care/duration of stay  If discussed at Long Length of Stay Meetings, dates discussed:   01/01/2012    Comments:  01/02/12- 1400- Donn Pierini RN, BSN [phone] Pt to d/c home today- no needs.  12/30/11- 1210Donn Pierini RN BSN [phone] Pt to have one CT removed today, other to remain to [facility]. NCM to cont. to follow for d/c needs  12/26/11 17:20 Letha Cape RN, BSN [phone] patient lives alone, NCM will continue to follow for dc needs.

## 2011-12-26 NOTE — Consult Note (Signed)
CARDIOTHORACIC SURGERY CONSULTATION REPORT  PCP is DEFAULT,PROVIDER, MD Referring Provider is Ky Barban, MD   Reason for consultation:  empyema  HPI:  Patient is a 56 year old previously healthy white male who developed sudden onset of cough with left-sided pleuritic chest pain 2 days ago. The patient states that prior to that he been in his usual state of health although he is been around other people who smoked heavily and 1 person who has recently had a particular severe case of bronchitis. In addition, approximately 2 weeks ago the patient suffered a fall on his left side. However, he did not have any pain in his chest at this time of the fall nor since then until 2 days ago. The patient presented to the hospital where he was noted to have elevated white blood count.  Chest CT scan was performed and notable for the absence of pulmonary embolus but diffuse bibasilar heterogeneous airspace opacity left greater than right consistent with likely pneumonia. The patient also had small left-sided pleural effusion. Repeat CT scan was performed today because of increasing pain and notable for increased size of pleural effusion on the left with increased associated left lower lobe collapse. Pulmonary medicine consult was requested and the patient was seen in consult by Dr. Molli Knock. Cardiothoracic surgery consultation was recommended for thoracoscopic drainage.  Dr. Molli Knock has signed off the case.  The patient reports continued left-sided chest pain with inspiration and cough. He denies productive cough and reports overall his cough has subsided over the last 24 hours. He denies shortness of breath. He reports no fevers chills or diaphoresis. Appetite has been good prior to admission.  Past Medical History  Diagnosis Date  . Anxiety     Past Surgical History  Procedure Date  . Dental surgery     History reviewed. No pertinent family history.  History   Social History  .  Marital Status: Single    Spouse Name: N/A    Number of Children: N/A  . Years of Education: N/A   Occupational History  . Not on file.   Social History Main Topics  . Smoking status: Former Games developer  . Smokeless tobacco: Not on file  . Alcohol Use: 50.4 oz/week    84 Cans of beer per week  . Drug Use: Yes     Comment: takes someone else's prescription hydrocodone  . Sexually Active: Not on file   Other Topics Concern  . Not on file   Social History Narrative  . No narrative on file    Prior to Admission medications   Medication Sig Start Date End Date Taking? Authorizing Provider  ALPRAZolam Prudy Feeler) 0.5 MG tablet Take 0.5 mg by mouth 3 (three) times daily.   Yes Historical Provider, MD  aspirin 81 MG chewable tablet Chew 324 mg by mouth once.   Yes Historical Provider, MD  HYDROcodone-acetaminophen (NORCO) 10-325 MG per tablet Take 1 tablet by mouth 3 (three) times daily.   Yes Historical Provider, MD    Current Facility-Administered Medications  Medication Dose Route Frequency Provider Last Rate Last Dose  . ALPRAZolam Prudy Feeler) tablet 0.5 mg  0.5 mg Oral TID PRN Genelle Gather, MD      . azithromycin [facility]) tablet 500 mg  500 mg Oral Q24H Manuela Schwartz, MD   500 mg at 12/26/11 1127  . enoxaparin (LOVENOX) injection 40 mg  40 mg Subcutaneous Q24H Manuela Schwartz, MD   40 mg at  12/26/11 1542  . folic acid (FOLVITE) tablet 1 mg  1 mg Oral Daily Ky Barban, MD   1 mg at 12/26/11 1543  . HYDROcodone-acetaminophen (NORCO) 10-325 MG per tablet 1 tablet  1 tablet Oral Q8H PRN Genelle Gather, MD   1 tablet at 12/26/11 1146  . HYDROmorphone (DILAUDID) injection 1-2 mg  1-2 mg Intravenous Q2H PRN Tacey Heap, MD   1 mg at 12/26/11 1617  . [COMPLETED] iohexol (OMNIPAQUE) 300 MG/ML solution 80 mL  80 mL Intravenous Once PRN Medication Radiologist, MD   80 mL at 12/26/11 1510  . ketorolac (TORADOL) 30 MG/ML injection 30 mg  30 mg Intravenous Q8H Genelle Gather, MD   30 mg at 12/26/11 1543  . LORazepam (ATIVAN) tablet 1 mg  1 mg Oral Q6H PRN Ky Barban, MD       Or  . LORazepam (ATIVAN) injection 1 mg  1 mg Intravenous Q6H PRN Ky Barban, MD      . multivitamin with minerals tablet 1 tablet  1 tablet Oral Daily Ky Barban, MD   1 tablet at 12/26/11 1544  . ondansetron (ZOFRAN) injection 4 mg  4 mg Intravenous Q6H PRN Genelle Gather, MD      . pantoprazole (PROTONIX) EC tablet 40 mg  40 mg Oral Daily Brett Fairy, PHARMD      . piperacillin-tazobactam (ZOSYN) IVPB 3.375 g  3.375 g Intravenous Q8H Ky Barban, MD   3.375 g at 12/26/11 1555  . [COMPLETED] potassium chloride SA (K-DUR,KLOR-CON) CR tablet 40 mEq  40 mEq Oral Once Ky Barban, MD   40 mEq at 12/26/11 1555  . thiamine (VITAMIN B-1) tablet 100 mg  100 mg Oral Daily Ky Barban, MD   100 mg at 12/26/11 1545   Or  . thiamine (B-1) injection 100 mg  100 mg Intravenous Daily Ky Barban, MD      . [DISCONTINUED] ALPRAZolam Prudy Feeler) tablet 0.5 mg  0.5 mg Oral TID Manuela Schwartz, MD   0.5 mg at 12/25/11 2133  . [DISCONTINUED] aspirin EC tablet 81 mg  81 mg Oral Once Ky Barban, MD      . [DISCONTINUED] cefTRIAXone (ROCEPHIN) 1 g in dextrose 5 % 50 mL IVPB  1 g Intravenous Q24H Manuela Schwartz, MD   1 g at 12/26/11 1124  . [DISCONTINUED] HYDROcodone-acetaminophen (NORCO) 10-325 MG per tablet 1 tablet  1 tablet Oral TID Manuela Schwartz, MD   1 tablet at 12/25/11 2133  . [DISCONTINUED] HYDROmorphone (DILAUDID) injection 1-2 mg  1-2 mg Intravenous Q2H PRN Manuela Schwartz, MD   2 mg at 12/26/11 0511  . [DISCONTINUED] ibuprofen (ADVIL,MOTRIN) tablet 800 mg  800 mg Oral Q6H Manuela Schwartz, MD   800 mg at 12/26/11 0236  . [DISCONTINUED] ketorolac (TORADOL) 30 MG/ML injection 30 mg  30 mg Intravenous Q8H Genelle Gather, MD      . [DISCONTINUED] pantoprazole (PROTONIX) injection 40 mg  40 mg  Intravenous Q24H Genelle Gather, MD   40 mg at 12/26/11 1124    No Known Allergies    Review of Systems:   General:  normal appetite, normal energy, no weight gain, + intentional weight loss, no fever  Cardiac:  no chest pain with exertion, + chest pain at rest, No SOB with exertion, no resting SOB, no PND, no orthopnea, no palpitations, no arrhythmia, no atrial fibrillation, no LE edema, no dizzy spells,  no syncope  Respiratory:  no shortness of breath, no home oxygen, no productive cough, + dry cough, no bronchitis, no wheezing, no hemoptysis, no asthma, + pain with inspiration or cough, no sleep apnea, no CPAP at night  GI:   no difficulty swallowing, no reflux, no frequent heartburn, no hiatal hernia, no abdominal pain, no constipation, no diarrhea, no hematochezia, no hematemesis, no melena  GU:   no dysuria,  no frequency, no urinary tract infection, no hematuria, no enlarged prostate, no kidney stones, no kidney disease  Vascular:  no pain suggestive of claudication, no pain in feet, no leg cramps, no varicose veins, no DVT, no non-healing foot ulcer  Neuro:   no stroke, no TIA's, no seizures, no headaches, no temporary blindness one eye,  no slurred speech, no peripheral neuropathy, + chronic pain in lower back, no instability of gait, no memory/cognitive dysfunction  Musculoskeletal: + arthritis, no joint swelling, no myalgias, no difficulty walking, normal mobility   Skin:   no rash, no itching, no skin infections, no pressure sores or ulcerations  Psych:   + anxiety, no depression, no nervousness, + unusual recent stress  Eyes:   mp blurry vision, mp floaters, mp recent vision changes,    ENT:   no hearing loss, no loose or painful teeth  Hematologic:  no easy bruising, no abnormal bleeding, no clotting disorder, no frequent epistaxis  Endocrine:  no diabetes, does not check CBG's at home     Physical Exam:   BP 128/72  Pulse 84  Temp 98.8 F (37.1 C) (Oral)  Resp 20  Ht  5\' 10"  (1.778 m)  Wt 85.4 kg (188 lb 4.4 oz)  BMI 27.01 kg/m2  SpO2 98%  General:    well-appearing  HEENT:  Unremarkable   Neck:   no JVD, no bruits, no adenopathy   Chest:   Diminished breath sounds left base , no wheezes, no rhonchi   CV:   RRR, no murmur   Abdomen:  soft, non-tender, no masses   Extremities:  warm, well-perfused, pulses palpable  Rectal/GU  Deferred  Neuro:   Grossly non-focal and symmetrical throughout  Skin:   Clean and dry, no rashes, no breakdown  Diagnostic Tests:  *RADIOLOGY REPORT*  Clinical Data: Post fall 1 week ago, now with left-sided rib pain,  cough, shortness of breath, fever  CT ANGIOGRAPHY CHEST  Technique: Multidetector CT imaging of the chest using the  standard protocol during bolus administration of intravenous  contrast. Multiplanar reconstructed images including MIPs were  obtained and reviewed to evaluate the vascular anatomy.  Contrast: OMNIPAQUE IOHEXOL 350 MG/ML SOLN  Comparison: Chest radiograph - earlier same day  Vascular Findings:  Suboptimal opacification of the pulmonary arterial system with the  main pulmonary artery measuring only 200 HU. Given this  limitation, there are no discrete filling defects within the  pulmonary arterial tree to the level of the segmental branches.  Evaluation of the distal subsegmental pulmonary arteries is  degraded secondary to suboptimal vessel opacification and patient  motion artifact. Borderline enlargement of the main pulmonary  artery measuring 3.4 cm in greatest transverse axial dimension  (image 124, series six).  Normal heart size. Coronary artery calcifications. No pericardial  effusion. Normal caliber of the thoracic aorta. Conventional  configuration of the aortic arch. A minimal amount of pleural  fluid layers adjacent to the descending thoracic aorta. No  periaortic stranding or evidence of thoracic aortic dissection.  Nonvascular findings:  Evaluation of the  pulmonary  parenchyma is minimally degraded  secondary to patient respiratory artifact.  Small left-sided pleural effusion with left basilar heterogeneous  air space opacities with scattered air bronchograms, worrisome for  infection. Heterogeneous opacities, some of which appear to have a  tree in bud configuration within the right lower lung (images 72  and 77, series five) are also worrisome for infection.  Centrilobular emphysema. No pneumothorax. Central airways are  patent. Scattered shoddy mediastinal lymph nodes are not enlarged  by CT criteria. No axillary adenopathy.  Incidental imaging of the upper abdomen is normal.  No acute or aggressive osseous abnormalities, specifically, no  displaced left-sided rib fractures.  IMPRESSION:  1. No definite evidence of pulmonary embolism to the level of the  bilateral subsegmental pulmonary arteries.  2. Small left-sided effusion with bibasilar heterogeneous airspace  opacities, left greater than right, worrisome for multifocal  infection, less likely aspiration. A follow-up chest radiograph in  4 to 6 weeks after treatment is recommended to ensure resolution.  3. Mild centrilobular emphysematous change.  4. Coronary artery calcifications. Borderline enlargement of the  main pulmonary artery. Further evaluation with cardiac echo may be  performed as clinically indicated.  5. No acute osseous abnormalities, specifically, no displaced left-  sided rib fractures.  Original Report Authenticated By: Tacey Ruiz, MD   *RADIOLOGY REPORT*  Clinical Data: Worsening left pleural effusion, evaluate for  empyema  CT CHEST WITH CONTRAST  Technique: Multidetector CT imaging of the chest was performed  following the standard protocol during bolus administration of  intravenous contrast.  Contrast: 80mL OMNIPAQUE IOHEXOL 300 MG/ML SOLN  Comparison: CTA chest dated 12/17/2011.  Findings: Moderate to large left pleural effusion, increased.  Effusion is  partially loculated and notable for enhancement of the  parietal pleura inferiorly (series 2/image 43), indicating that it  is at least exudative.  Associated small amount of epicardial fluid/stranding along the  left heart border/ superior mediastinum (series 2/image 36).  Volume loss/consolidation in the lingula and left lower lobe, some  which may reflect compressive atelectasis, although left lower lobe  pneumonia is suspected when correlating with recent CT. Additional  patchy opacity at the right lung base (series 3/image 42),  suspicious for pneumonia.  Mild centrilobular emphysematous changes. No pneumothorax.  Visualized thyroid is unremarkable.  The heart is top normal in size. Coronary atherosclerosis.  No suspicious mediastinal, hilar, or axillary lymphadenopathy.  Visualized upper abdomen is unremarkable.  Mild degenerative changes of the visualized thoracolumbar spine.  IMPRESSION:  Complex/partially loculated moderate to large left pleural  effusion, increased, with associated findings suggesting an  exudative effusion. In the appropriate clinical setting, an  empyema cannot be excluded.  Associated small amount of epicardial fluid/stranding along the  left heart border/ superior mediastinum.  Suspected bilateral lower lobe pneumonia, left greater than right.  Original Report Authenticated By: Charline Bills, M.D.     Impression:  Community acquired pneumonia with enlarging parapneumonic effusion and possible acute suppurative empyema. I agree that the patient would likely benefit from thoracoscopic drainage.  Plan:  I've discussed the indications, risks, and potential benefits of video-assisted thoracoscopy with possible thoracotomy for drainage of the patient's left pleural effusion possible empyema. We will also plan fiberoptic bronchoscopy to rule out aortic obstruction and obtain bronchial washings for culture. The patient understands and accepts all potential  associated risks of surgery including but not limited to risk of death, myocardial infarction, stroke, pulmonary embolus, bleeding requiring blood transfusion, arrhythmia, prolonged chest wall pain, prolonged  air leak, late recurrence of pneumonia and/or empyema. All of his questions been addressed. We plan to proceed with surgery tomorrow morning.    Salvatore Decent. Cornelius Moras, MD 12/26/2011 6:17 PM
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
## 2011-12-27 ENCOUNTER — Inpatient Hospital Stay (HOSPITAL_COMMUNITY): Payer: BC Managed Care – PPO | Admitting: Anesthesiology

## 2011-12-27 ENCOUNTER — Inpatient Hospital Stay (HOSPITAL_COMMUNITY): Payer: BC Managed Care – PPO

## 2011-12-27 ENCOUNTER — Encounter (HOSPITAL_COMMUNITY): Payer: Self-pay | Admitting: Anesthesiology

## 2011-12-27 ENCOUNTER — Encounter (HOSPITAL_COMMUNITY)
Admission: EM | Disposition: A | Discharge status: Home / Self Care | Payer: Self-pay | Source: Home / Self Care | Attending: Internal Medicine

## 2011-12-27 ENCOUNTER — Encounter (HOSPITAL_COMMUNITY): Payer: Self-pay | Admitting: Thoracic Surgery (Cardiothoracic Vascular Surgery)

## 2011-12-27 DIAGNOSIS — J869 Pyothorax without fistula: Secondary | ICD-10-CM

## 2011-12-27 HISTORY — DX: Pyothorax without fistula: J86.9

## 2011-12-27 HISTORY — PX: VIDEO BRONCHOSCOPY: SHX5072

## 2011-12-27 HISTORY — PX: VIDEO ASSISTED THORACOSCOPY (VATS)/EMPYEMA: SHX6172

## 2011-12-27 LAB — PROTIME-INR: INR: 1.24 (ref 0.00–1.49)

## 2011-12-27 LAB — GRAM STAIN

## 2011-12-27 LAB — URINALYSIS, ROUTINE W REFLEX MICROSCOPIC
Hgb urine dipstick: NEGATIVE
Nitrite: NEGATIVE
Specific Gravity, Urine: >1.046 — ABNORMAL HIGH (ref 1.005–1.030)
Urobilinogen, UA: 0.2 mg/dL (ref 0.0–1.0)
pH: 5.5 (ref 5.0–8.0)

## 2011-12-27 LAB — CBC
HCT: 31.3 % — ABNORMAL LOW (ref 39.0–52.0)
Hemoglobin: 10.5 g/dL — ABNORMAL LOW (ref 13.0–17.0)
MCHC: 33.5 g/dL (ref 30.0–36.0)
RBC: 3.19 MIL/uL — ABNORMAL LOW (ref 4.22–5.81)

## 2011-12-27 LAB — URINE CULTURE: Colony Count: NO GROWTH

## 2011-12-27 LAB — BODY FLUID CELL COUNT WITH DIFFERENTIAL
Monocyte-Macrophage-Serous Fluid: 3 % — ABNORMAL LOW (ref 50–90)
Total Nucleated Cell Count, Fluid: 8591 cu mm — ABNORMAL HIGH (ref 0–1000)

## 2011-12-27 LAB — PROTEIN, BODY FLUID: Total protein, fluid: 5.1 g/dL

## 2011-12-27 LAB — AMYLASE, BODY FLUID: Amylase, Fluid: 29 U/L

## 2011-12-27 LAB — URINE MICROSCOPIC-ADD ON

## 2011-12-27 LAB — COMPREHENSIVE METABOLIC PANEL
ALT: 5 U/L (ref 0–53)
Alkaline Phosphatase: 57 U/L (ref 39–117)
BUN: 15 mg/dL (ref 6–23)
CO2: 24 mEq/L (ref 19–32)
Chloride: 98 mEq/L (ref 96–112)
GFR calc Af Amer: 87 mL/min — ABNORMAL LOW (ref >90)
GFR calc non Af Amer: 75 mL/min — ABNORMAL LOW (ref >90)
Glucose, Bld: 123 mg/dL — ABNORMAL HIGH (ref 70–99)
Potassium: 3.9 mEq/L (ref 3.5–5.1)
Sodium: 133 mEq/L — ABNORMAL LOW (ref 135–145)
Total Bilirubin: 0.5 mg/dL (ref 0.3–1.2)
Total Protein: 6.4 g/dL (ref 6.0–8.3)

## 2011-12-27 LAB — TYPE AND SCREEN
ABO/RH(D): A POS
Antibody Screen: NEGATIVE

## 2011-12-27 LAB — GLUCOSE, SEROUS FLUID: Glucose, Fluid: <20 mg/dL

## 2011-12-27 SURGERY — VIDEO ASSISTED THORACOSCOPY (VATS)/EMPYEMA
Anesthesia: General | Site: Chest | Wound class: Clean

## 2011-12-27 MED ORDER — ONDANSETRON HCL 4 MG/2ML IJ SOLN
4.0000 mg | Freq: Four times a day (QID) | INTRAMUSCULAR | Status: DC | PRN
  Administered 2011-12-29: 4 mg via INTRAVENOUS
  Filled 2011-12-27: qty 2

## 2011-12-27 MED ORDER — SODIUM CHLORIDE 0.9 % IJ SOLN: 9.0000 mL | INTRAMUSCULAR | Status: DC | PRN

## 2011-12-27 MED ORDER — HYDROMORPHONE HCL PF 1 MG/ML IJ SOLN
INTRAMUSCULAR | Status: AC
  Filled 2011-12-27: qty 1

## 2011-12-27 MED ORDER — DIPHENHYDRAMINE HCL 12.5 MG/5ML PO ELIX
12.5000 mg | ORAL_SOLUTION | Freq: Four times a day (QID) | ORAL | Status: DC | PRN
  Filled 2011-12-27: qty 5

## 2011-12-27 MED ORDER — MORPHINE SULFATE 2 MG/ML IJ SOLN
2.0000 mg | INTRAMUSCULAR | Status: DC | PRN
  Administered 2011-12-27: 2 mg via INTRAVENOUS
  Filled 2011-12-27: qty 1

## 2011-12-27 MED ORDER — LORAZEPAM 2 MG/ML IJ SOLN
INTRAMUSCULAR | Status: AC
  Filled 2011-12-27: qty 1

## 2011-12-27 MED ORDER — 0.9 % SODIUM CHLORIDE (POUR BTL) OPTIME
TOPICAL | Status: DC | PRN
  Administered 2011-12-27: 2000 mL

## 2011-12-27 MED ORDER — HYDROMORPHONE HCL PF 1 MG/ML IJ SOLN
0.2500 mg | INTRAMUSCULAR | Status: DC | PRN
  Administered 2011-12-27 (×4): 0.5 mg via INTRAVENOUS

## 2011-12-27 MED ORDER — GLYCOPYRROLATE 0.2 MG/ML IJ SOLN
INTRAMUSCULAR | Status: DC | PRN
  Administered 2011-12-27: .4 mg via INTRAVENOUS

## 2011-12-27 MED ORDER — SODIUM CHLORIDE 0.9 % IR SOLN
Status: DC | PRN
  Administered 2011-12-27: 2000 mL

## 2011-12-27 MED ORDER — MIDAZOLAM HCL 5 MG/5ML IJ SOLN
INTRAMUSCULAR | Status: DC | PRN
  Administered 2011-12-27: 2 mg via INTRAVENOUS

## 2011-12-27 MED ORDER — MORPHINE SULFATE (PF) 1 MG/ML IV SOLN
INTRAVENOUS | Status: DC
  Administered 2011-12-27: 24.81 mg via INTRAVENOUS
  Administered 2011-12-27 – 2011-12-28 (×2): via INTRAVENOUS
  Administered 2011-12-28: 21 mg via INTRAVENOUS
  Administered 2011-12-28: 09:00:00 via INTRAVENOUS
  Administered 2011-12-28: 22 mg via INTRAVENOUS
  Administered 2011-12-28: 13.7 mg via INTRAVENOUS
  Administered 2011-12-28: 28.5 mg via INTRAVENOUS
  Administered 2011-12-28: 25.07 mg via INTRAVENOUS
  Administered 2011-12-28: 16:00:00 via INTRAVENOUS
  Administered 2011-12-29: 15 mg via INTRAVENOUS
  Administered 2011-12-29 (×2): via INTRAVENOUS
  Administered 2011-12-29: 22.4 mg via INTRAVENOUS
  Administered 2011-12-29 (×2): via INTRAVENOUS
  Administered 2011-12-29: 22.5 mg via INTRAVENOUS
  Administered 2011-12-29: 26.44 mg via INTRAVENOUS
  Administered 2011-12-29: 27.82 mg via INTRAVENOUS
  Administered 2011-12-29: 26.4 mg via INTRAVENOUS
  Administered 2011-12-30: 25.04 mg via INTRAVENOUS
  Administered 2011-12-30 (×2): via INTRAVENOUS
  Administered 2011-12-30: 19.5 mL via INTRAVENOUS
  Administered 2011-12-30 (×2): via INTRAVENOUS
  Administered 2011-12-30: 26.38 mg via INTRAVENOUS
  Administered 2011-12-31: 26.83 mg via INTRAVENOUS
  Administered 2011-12-31: 21:00:00 via INTRAVENOUS
  Administered 2011-12-31: 24.93 mL via INTRAVENOUS
  Administered 2011-12-31: 13:00:00 via INTRAVENOUS
  Administered 2011-12-31: 20.38 mg via INTRAVENOUS
  Administered 2011-12-31: 27.95 mg via INTRAVENOUS
  Administered 2011-12-31: 16:00:00 via INTRAVENOUS
  Administered 2011-12-31: 18 mL via INTRAVENOUS
  Administered 2011-12-31: 29.5 mg via INTRAVENOUS
  Administered 2011-12-31: 08:00:00 via INTRAVENOUS
  Administered 2012-01-01: 27.51 mg via INTRAVENOUS
  Administered 2012-01-01 (×2): via INTRAVENOUS
  Administered 2012-01-01: 19.5 mg via INTRAVENOUS
  Filled 2011-12-27 (×27): qty 25

## 2011-12-27 MED ORDER — VECURONIUM BROMIDE 10 MG IV SOLR
INTRAVENOUS | Status: DC | PRN
  Administered 2011-12-27: 5 mg via INTRAVENOUS

## 2011-12-27 MED ORDER — PIPERACILLIN-TAZOBACTAM 3.375 G IVPB
3.3750 g | Freq: Three times a day (TID) | INTRAVENOUS | Status: DC
  Administered 2011-12-27 – 2011-12-29 (×7): 3.375 g via INTRAVENOUS
  Filled 2011-12-27 (×9): qty 50

## 2011-12-27 MED ORDER — OXYCODONE-ACETAMINOPHEN 5-325 MG PO TABS
1.0000 | ORAL_TABLET | ORAL | Status: DC | PRN
  Administered 2011-12-29: 2 via ORAL
  Filled 2011-12-27: qty 2

## 2011-12-27 MED ORDER — ONDANSETRON HCL 4 MG/2ML IJ SOLN: 4.0000 mg | Freq: Four times a day (QID) | INTRAMUSCULAR | Status: DC | PRN

## 2011-12-27 MED ORDER — ROCURONIUM BROMIDE 100 MG/10ML IV SOLN
INTRAVENOUS | Status: DC | PRN
  Administered 2011-12-27: 50 mg via INTRAVENOUS

## 2011-12-27 MED ORDER — OXYCODONE-ACETAMINOPHEN 5-325 MG PO TABS
1.0000 | ORAL_TABLET | ORAL | Status: DC | PRN
  Administered 2011-12-28 – 2012-01-01 (×15): 2 via ORAL
  Administered 2012-01-01: 1 via ORAL
  Administered 2012-01-02 (×2): 2 via ORAL
  Filled 2011-12-27: qty 1
  Filled 2011-12-27 (×7): qty 2
  Filled 2011-12-27: qty 1
  Filled 2011-12-27 (×10): qty 2

## 2011-12-27 MED ORDER — DEXTROSE 5 % IV SOLN
1.0000 g | INTRAVENOUS | Status: AC
  Administered 2011-12-27: 1 g via INTRAVENOUS
  Filled 2011-12-27: qty 10

## 2011-12-27 MED ORDER — OXYCODONE HCL 5 MG/5ML PO SOLN: 5.0000 mg | Freq: Once | ORAL | Status: DC | PRN

## 2011-12-27 MED ORDER — LORAZEPAM 0.5 MG PO TABS
0.5000 mg | ORAL_TABLET | Freq: Four times a day (QID) | ORAL | Status: AC | PRN
  Administered 2011-12-27: 0.5 mg via ORAL
  Filled 2011-12-27: qty 1

## 2011-12-27 MED ORDER — DIPHENHYDRAMINE HCL 50 MG/ML IJ SOLN: 12.5000 mg | Freq: Four times a day (QID) | INTRAMUSCULAR | Status: DC | PRN

## 2011-12-27 MED ORDER — BISACODYL 5 MG PO TBEC
10.0000 mg | DELAYED_RELEASE_TABLET | Freq: Every day | ORAL | Status: DC
  Administered 2011-12-28 – 2012-01-02 (×6): 10 mg via ORAL
  Filled 2011-12-27 (×6): qty 2

## 2011-12-27 MED ORDER — OXYCODONE HCL 5 MG PO TABS: 5.0000 mg | ORAL_TABLET | Freq: Once | ORAL | Status: DC | PRN

## 2011-12-27 MED ORDER — ALBUTEROL SULFATE (5 MG/ML) 0.5% IN NEBU
2.5000 mg | INHALATION_SOLUTION | RESPIRATORY_TRACT | Status: DC
  Administered 2011-12-27 – 2011-12-30 (×16): 2.5 mg via RESPIRATORY_TRACT
  Filled 2011-12-27 (×16): qty 0.5

## 2011-12-27 MED ORDER — LACTATED RINGERS IV SOLN
INTRAVENOUS | Status: DC | PRN
  Administered 2011-12-27 (×2): via INTRAVENOUS

## 2011-12-27 MED ORDER — PHENYLEPHRINE HCL 10 MG/ML IJ SOLN
10.0000 mg | INTRAVENOUS | Status: DC | PRN
  Administered 2011-12-27: 20 ug/min via INTRAVENOUS

## 2011-12-27 MED ORDER — PROPOFOL 10 MG/ML IV BOLUS
INTRAVENOUS | Status: DC | PRN
  Administered 2011-12-27: 200 mg via INTRAVENOUS

## 2011-12-27 MED ORDER — VANCOMYCIN HCL IN DEXTROSE 1-5 GM/200ML-% IV SOLN
INTRAVENOUS | Status: AC
  Filled 2011-12-27: qty 200

## 2011-12-27 MED ORDER — NEOSTIGMINE METHYLSULFATE 1 MG/ML IJ SOLN
INTRAMUSCULAR | Status: DC | PRN
  Administered 2011-12-27: 2 mg via INTRAVENOUS

## 2011-12-27 MED ORDER — OXYCODONE HCL 5 MG PO TABS
5.0000 mg | ORAL_TABLET | ORAL | Status: AC | PRN
  Administered 2011-12-27: 10 mg via ORAL
  Filled 2011-12-27: qty 2

## 2011-12-27 MED ORDER — LORAZEPAM 2 MG/ML IJ SOLN: 0.5000 mg | Freq: Four times a day (QID) | INTRAMUSCULAR | Status: AC | PRN

## 2011-12-27 MED ORDER — FENTANYL CITRATE 0.05 MG/ML IJ SOLN
INTRAMUSCULAR | Status: DC | PRN
  Administered 2011-12-27: 50 ug via INTRAVENOUS
  Administered 2011-12-27: 125 ug via INTRAVENOUS
  Administered 2011-12-27 (×2): 50 ug via INTRAVENOUS
  Administered 2011-12-27: 125 ug via INTRAVENOUS
  Administered 2011-12-27 (×2): 50 ug via INTRAVENOUS

## 2011-12-27 MED ORDER — POTASSIUM CHLORIDE 10 MEQ/50ML IV SOLN: 10.0000 meq | Freq: Every day | INTRAVENOUS | Status: DC | PRN

## 2011-12-27 MED ORDER — NALOXONE HCL 0.4 MG/ML IJ SOLN: 0.4000 mg | INTRAMUSCULAR | Status: DC | PRN

## 2011-12-27 MED ORDER — ACETAMINOPHEN 10 MG/ML IV SOLN
1000.0000 mg | Freq: Four times a day (QID) | INTRAVENOUS | Status: AC
  Administered 2011-12-27 – 2011-12-28 (×4): 1000 mg via INTRAVENOUS
  Filled 2011-12-27 (×5): qty 100

## 2011-12-27 SURGICAL SUPPLY — 71 items
ADH SKN CLS APL DERMABOND .7 (GAUZE/BANDAGES/DRESSINGS) ×2
APL SRG 22X2 LUM MLBL SLNT (VASCULAR PRODUCTS)
APPLICATOR TIP EXT COSEAL (VASCULAR PRODUCTS) IMPLANT
APPLIER CLIP ROT 10 11.4 M/L (STAPLE)
APR CLP MED LRG 11.4X10 (STAPLE)
BLADE SURG 11 STRL SS (BLADE) ×1 IMPLANT
CANISTER SUCTION 2500CC (MISCELLANEOUS) ×3 IMPLANT
CATH KIT ON Q 5IN SLV (PAIN MANAGEMENT) IMPLANT
CATH THORACIC 28FR (CATHETERS) IMPLANT
CATH THORACIC 36FR (CATHETERS) ×1 IMPLANT
CATH THORACIC 36FR RT ANG (CATHETERS) ×2 IMPLANT
CLIP APPLIE ROT 10 11.4 M/L (STAPLE) IMPLANT
CLIP TI MEDIUM 6 (CLIP) ×4 IMPLANT
CLOTH BEACON ORANGE TIMEOUT ST (SAFETY) ×3 IMPLANT
CONN ST 1/4X3/8  BEN (MISCELLANEOUS) ×1
CONN ST 1/4X3/8 BEN (MISCELLANEOUS) IMPLANT
CONN Y 3/8X3/8X3/8  BEN (MISCELLANEOUS) ×2
CONN Y 3/8X3/8X3/8 BEN (MISCELLANEOUS) IMPLANT
CONT SPEC 4OZ CLIKSEAL STRL BL (MISCELLANEOUS) ×7 IMPLANT
COVER SURGICAL LIGHT HANDLE (MISCELLANEOUS) ×6 IMPLANT
DERMABOND ADVANCED (GAUZE/BANDAGES/DRESSINGS) ×1
DERMABOND ADVANCED .7 DNX12 (GAUZE/BANDAGES/DRESSINGS) IMPLANT
DRAPE LAPAROSCOPIC ABDOMINAL (DRAPES) ×3 IMPLANT
DRAPE WARM FLUID 44X44 (DRAPE) ×6 IMPLANT
DRILL BIT 7/64X5 (BIT) IMPLANT
ELECT REM PT RETURN 9FT ADLT (ELECTROSURGICAL) ×3
ELECTRODE REM PT RTRN 9FT ADLT (ELECTROSURGICAL) ×2 IMPLANT
FLUID NSS /IRRIG 3000 ML XXX (IV SOLUTION) ×3 IMPLANT
GLOVE BIO SURGEON STRL SZ 6 (GLOVE) ×3 IMPLANT
GLOVE BIO SURGEON STRL SZ 6.5 (GLOVE) ×1 IMPLANT
GLOVE EUDERMIC 7 POWDERFREE (GLOVE) ×6 IMPLANT
GLOVE ORTHO TXT STRL SZ7.5 (GLOVE) ×1 IMPLANT
GOWN PREVENTION PLUS XLARGE (GOWN DISPOSABLE) ×2 IMPLANT
GOWN STRL NON-REIN LRG LVL3 (GOWN DISPOSABLE) ×8 IMPLANT
KIT BASIN OR (CUSTOM PROCEDURE TRAY) ×3 IMPLANT
KIT ROOM TURNOVER OR (KITS) ×3 IMPLANT
KIT SUCTION CATH 14FR (SUCTIONS) ×1 IMPLANT
NS IRRIG 1000ML POUR BTL (IV SOLUTION) ×6 IMPLANT
PACK CHEST (CUSTOM PROCEDURE TRAY) ×3 IMPLANT
PAD ARMBOARD 7.5X6 YLW CONV (MISCELLANEOUS) ×6 IMPLANT
SEALANT PROGEL (MISCELLANEOUS) IMPLANT
SEALANT SURG COSEAL 4ML (VASCULAR PRODUCTS) IMPLANT
SEALANT SURG COSEAL 8ML (VASCULAR PRODUCTS) IMPLANT
SET IRRIG TUBING LAPAROSCOPIC (IRRIGATION / IRRIGATOR) ×4 IMPLANT
SOLUTION ANTI FOG 6CC (MISCELLANEOUS) ×3 IMPLANT
SPONGE GAUZE 4X4 12PLY (GAUZE/BANDAGES/DRESSINGS) ×3 IMPLANT
SUT MNCRL AB 3-0 PS2 18 (SUTURE) ×1 IMPLANT
SUT PROLENE 3 0 SH DA (SUTURE) IMPLANT
SUT PROLENE 4 0 RB 1 (SUTURE)
SUT PROLENE 4-0 RB1 .5 CRCL 36 (SUTURE) IMPLANT
SUT SILK  1 MH (SUTURE) ×5
SUT SILK 1 MH (SUTURE) ×4 IMPLANT
SUT SILK 2 0 SH CR/8 (SUTURE) ×1 IMPLANT
SUT SILK 2 0SH CR/8 30 (SUTURE) IMPLANT
SUT VIC AB 1 CTX 18 (SUTURE) IMPLANT
SUT VIC AB 1 CTX 36 (SUTURE)
SUT VIC AB 1 CTX36XBRD ANBCTR (SUTURE) IMPLANT
SUT VIC AB 2-0 CTX 36 (SUTURE) ×1 IMPLANT
SUT VIC AB 2-0 UR6 27 (SUTURE) ×1 IMPLANT
SUT VIC AB 3-0 SH 8-18 (SUTURE) ×2 IMPLANT
SUT VIC AB 3-0 X1 27 (SUTURE) ×4 IMPLANT
SUT VICRYL 2 TP 1 (SUTURE) ×1 IMPLANT
SYSTEM SAHARA CHEST DRAIN RE-I (WOUND CARE) ×3 IMPLANT
TAPE CLOTH 4X10 WHT NS (GAUZE/BANDAGES/DRESSINGS) ×3 IMPLANT
TAPE CLOTH SURG 4X10 WHT LF (GAUZE/BANDAGES/DRESSINGS) ×1 IMPLANT
TIP APPLICATOR SPRAY EXTEND 16 (VASCULAR PRODUCTS) IMPLANT
TOWEL OR 17X24 6PK STRL BLUE (TOWEL DISPOSABLE) ×6 IMPLANT
TOWEL OR 17X26 10 PK STRL BLUE (TOWEL DISPOSABLE) ×6 IMPLANT
TRAP SPECIMEN MUCOUS 40CC (MISCELLANEOUS) ×4 IMPLANT
TRAY FOLEY CATH 14FRSI W/METER (CATHETERS) ×3 IMPLANT
WATER STERILE IRR 1000ML POUR (IV SOLUTION) ×6 IMPLANT

## 2011-12-27 NOTE — Progress Notes (Signed)
MD notified about pts pre-op ABGs.  [place], [place], Charity fundraiser

## 2011-12-27 NOTE — Anesthesia Preprocedure Evaluation (Addendum)
Anesthesia Evaluation  Patient identified by MRN, date of birth, ID band Patient awake    Reviewed: Allergy & Precautions, H&P , NPO status , Patient's Chart, lab work & pertinent test results  Airway Mallampati: II TM Distance: >3 FB Neck ROM: full    Dental  (+) Poor Dentition, Loose and Dental Advisory Given   Pulmonary pneumonia -, COPDformer smoker,  empyema         Cardiovascular     Neuro/Psych Anxiety    GI/Hepatic   Endo/Other    Renal/GU      Musculoskeletal   Abdominal   Peds  Hematology   Anesthesia Other Findings   Reproductive/Obstetrics                          Anesthesia Physical Anesthesia Plan  ASA: II  Anesthesia Plan: General   Post-op Pain Management:    Induction: Intravenous  Airway Management Planned: Double Lumen EBT  Additional Equipment: Arterial line and CVP  Intra-op Plan:   Post-operative Plan: Extubation in OR  Informed Consent: I have reviewed the patients History and Physical, chart, labs and discussed the procedure including the risks, benefits and alternatives for the proposed anesthesia with the patient or authorized representative who has indicated his/her understanding and acceptance.     Plan Discussed with: CRNA and Surgeon  Anesthesia Plan Comments:         Anesthesia Quick Evaluation

## 2011-12-27 NOTE — OR Nursing (Signed)
End of first procedure @ 09:37am, start of 2nd procedure @ 10:06am

## 2011-12-27 NOTE — Brief Op Note (Signed)
12/25/2011 - 12/27/2011  10:54 AM  PATIENT:  Charles Dawson  56 y.o. male  PRE-OPERATIVE DIAGNOSIS:  empyema  POST-OPERATIVE DIAGNOSIS:  empyema  PROCEDURE:  Procedure(s) (LRB) with comments: VIDEO ASSISTED THORACOSCOPY (VATS)/EMPYEMA (Left) VIDEO BRONCHOSCOPY (N/A)  SURGEON:  Surgeon(s) and Role:    * Purcell Nails, MD - Primary  PHYSICIAN ASSISTANT:        Doree Fudge, PA-C  ANESTHESIA:   general  EBL:  Total I/O In: 1000 [I.V.:1000] Out: 185 [Urine:125; Blood:60]  BLOOD ADMINISTERED:none  DRAINS: 3 Chest Tube(s) in the left pleural space   LOCAL MEDICATIONS USED:  NONE  SPECIMEN:  Source of Specimen:  pleura and pleural fluid  DISPOSITION OF SPECIMEN:  PATHOLOGY  COUNTS:  YES  TOURNIQUET:  * No tourniquets in log *  DICTATION: .Note written in EPIC  PLAN OF CARE: Admit to inpatient   PATIENT DISPOSITION:  PACU - hemodynamically stable.   Delay start of Pharmacological VTE agent (>24hrs) due to surgical blood loss or risk of bleeding: yes  Detavious Rinn H 12/27/2011 10:56 AM

## 2011-12-27 NOTE — Anesthesia Procedure Notes (Addendum)
Procedure Name: Intubation Date/Time: 12/27/2011 9:22 AM Performed by: Alanda Amass A Pre-anesthesia Checklist: Patient identified, Timeout performed, Emergency Drugs available, Suction available and Patient being monitored Patient Re-evaluated:Patient Re-evaluated prior to inductionOxygen Delivery Method: Circle system utilized Preoxygenation: Pre-oxygenation with 100% oxygen Intubation Type: IV induction Ventilation: Mask ventilation with difficulty Tube type: Oral Tube size: 9.0 mm Number of attempts: 1 Airway Equipment and Method: Video-laryngoscopy Placement Confirmation: ETT inserted through vocal cords under direct vision,  breath sounds checked- equal and bilateral and positive ETCO2 Secured at: 23 cm Tube secured with: Tape Dental Injury: Teeth and Oropharynx as per pre-operative assessment    Procedure Name: Intubation Date/Time: 12/27/2011 9:39 AM Performed by: Alanda Amass A Pre-anesthesia Checklist: Patient identified, Emergency Drugs available, Suction available, Patient being monitored and Timeout performed Oxygen Delivery Method: Circle system utilized Preoxygenation: Pre-oxygenation with 100% oxygen Tube type: Oral Endobronchial tube: Left, Double lumen EBT, EBT position confirmed by auscultation and EBT position confirmed by fiberoptic bronchoscope and 39 Fr Number of attempts: 1 Airway Equipment and Method: Video-laryngoscopy Secured at: 32 cm Tube secured with: Tape Dental Injury: Teeth and Oropharynx as per pre-operative assessment

## 2011-12-27 NOTE — Op Note (Signed)
CARDIOTHORACIC SURGERY OPERATIVE NOTE  Date of Procedure:   12/27/2011  Preoperative Diagnosis:  Community -acquired pneumonia with Left empyema  Postoperative Diagnosis:  same  Procedure:     1. Flexible Bronchoscopy with Endobronchial Lavage  2. Left Video-assisted Thoracoscopy for Drainage of Empyema   Surgeon:    Salvatore Decent. Cornelius Moras, MD  Assistant:    Doree Fudge, PA-C  Anesthesia:    Achille Rich, MD  Operative Findings:   Acute empyema  Serous pleural fluid     BRIEF CLINICAL NOTE AND INDICATIONS FOR SURGERY  Patient is a 56 year old previously healthy white male who developed sudden onset of cough with left-sided pleuritic chest pain 2 days ago. The patient states that prior to that he been in his usual state of health although he is been around other people who smoked heavily and 1 person who has recently had a particular severe case of bronchitis. In addition, approximately 2 weeks ago the patient suffered a fall on his left side. However, he did not have any pain in his chest at this time of the fall nor since then until 2 days ago. The patient presented to the hospital where he was noted to have elevated white blood count. Chest CT scan was performed and notable for the absence of pulmonary embolus but diffuse bibasilar heterogeneous airspace opacity left greater than right consistent with likely pneumonia. The patient also had small left-sided pleural effusion. Repeat CT scan was performed because of increasing pain and notable for increased size of pleural effusion on the left with increased associated left lower lobe collapse. Pulmonary medicine consult was requested and the patient was seen in consult by Dr. Molli Knock. Cardiothoracic surgery consultation was recommended for thoracoscopic drainage. The patient has been seen in consultation and counseled at length regarding the indications, risks and potential benefits of surgery.  All questions have been answered, and  the patient provides full informed consent for the operation as described.       DETAILS OF THE OPERATIVE PROCEDURE  The patient is brought to the operating room on the above mentioned date And placed in the supine position on the operating table. Central venous line and radial arterial line are placed by the anesthesia team. Intravenous antibiotics are administered and pneumatic sequential compression boots are placed on both lower extremities. General endotracheal anesthesia is induced uneventfully. The patient is initially intubated using a single lumen endotracheal tube.  Flexible fiberoptic bronchoscopy is performed through the patient's existing endotracheal tube. The distal trachea, carina, left and right endobronchial trees are visualized.  There is mild erythema in the left endobronchial tree with mild thin secretions.  There is normal endobronchial anatomy with no foreign body nor endobronchial mass lesion seen.  The endobronchial tree is lavaged with saline solution and portions of this saline aspirated are trapped to be sent for endobronchial washings. The bronchoscope was removed uneventfully.  The patient was reintubated using a dual lumen endotracheal tube. The patient is turned to the right lateral decubitus position.  An axillary roll is placed and the patient's left chest was prepared and draped in a sterile manner.  A small incision is made overlying the seventh intercostal space and the anterior axillary line. The incision is completed into the pleural space with electrocautery. The pleural space is palpated and a blunt sucker tip is placed into the pleural space to evacuate pleural fluid. A total of 800 mL of serous fluid are evacuated and portions of the pleural fluid or trapped to be  sent for cytology, culture, and routine laboratory evaluation.  A 10 mm port was passed through the incision and a thoracoscopic camera advanced through the port. The pleural space is examined  visually.  There is evidence of acute empyema with acute inflammatory reaction diffusely throughout. The underlying lung parenchyma is densely hyperemic and consolidated. A second incision is placed posteriorly through the sixth intercostal space. Through this working miniature thoracotomy incision the remainder of the pleural space is mobilized circumferentially to break up all loculations and drained all pockets of fluid. Portions of pleural peel are removed and sent for routine histology. Ultimately both the left upper and left lower lobe were completely mobilized as is the major fissure. No other abnormalities are noted.  The pleural space is irrigated with copious warm saline solution. The pleural space is drained using 3 chest tubes exited through separate stab incisions including the original port incision. The remaining incisions are closed in multiple layers in routine fashion. The 3 chest tubes were fixed to close suction drainage device.  The patient tolerated the procedure well, was extubated in the operative room, and transported to the postanesthesia care in stable condition. All sponge instrument and needle counts are verified correct. There were no intraoperative complications. Estimated blood loss was trivial      Salvatore Decent. Cornelius Moras MD 12/27/2011 11:20 AM

## 2011-12-27 NOTE — Transfer of Care (Signed)
Immediate Anesthesia Transfer of Care Note  Patient: Charles Dawson  Procedure(s) Performed: Procedure(s) (LRB) with comments: VIDEO ASSISTED THORACOSCOPY (VATS)/EMPYEMA (Left) VIDEO BRONCHOSCOPY (N/A)  Patient Location: PACU  Anesthesia Type:General  Level of Consciousness: awake  Airway & Oxygen Therapy: Patient Spontanous Breathing and Patient connected to face mask oxygen  Post-op Assessment: Report given to PACU RN and Post -op Vital signs reviewed and stable  Post vital signs: Reviewed and stable  Complications: No apparent anesthesia complications
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
## 2011-12-27 NOTE — Progress Notes (Signed)
Pt arrived from PACU, arousable, oriented, VSS, low grade temp.  Admin APAP, will continue to monitor.

## 2011-12-28 ENCOUNTER — Inpatient Hospital Stay (HOSPITAL_COMMUNITY): Payer: BC Managed Care – PPO

## 2011-12-28 LAB — BLOOD GAS, ARTERIAL
Acid-base deficit: 0.7 mmol/L (ref 0.0–2.0)
Drawn by: 252031
O2 Content: 2 L/min
O2 Saturation: 99.5 %
Patient temperature: 98.6
pO2, Arterial: 154 mmHg — ABNORMAL HIGH (ref 80.0–100.0)

## 2011-12-28 LAB — BASIC METABOLIC PANEL
BUN: 12 mg/dL (ref 6–23)
Chloride: 98 mEq/L (ref 96–112)
Creatinine, Ser: 0.99 mg/dL (ref 0.50–1.35)
GFR calc non Af Amer: 88 mL/min — ABNORMAL LOW (ref >90)
Glucose, Bld: 92 mg/dL (ref 70–99)
Potassium: 4.4 mEq/L (ref 3.5–5.1)

## 2011-12-28 LAB — CULTURE, BLOOD (ROUTINE X 2)

## 2011-12-28 LAB — CBC
Hemoglobin: 9.9 g/dL — ABNORMAL LOW (ref 13.0–17.0)
MCH: 31.7 pg (ref 26.0–34.0)
MCHC: 32.5 g/dL (ref 30.0–36.0)
RDW: 13.3 % (ref 11.5–15.5)

## 2011-12-28 NOTE — Progress Notes (Addendum)
Pt tol chair, VSS, Pt using 3 MSO4 per shift. Consider reduced dose?

## 2011-12-28 NOTE — Progress Notes (Addendum)
                   [address]            [place] [address]          [phone]    1 Day Post-Op Procedure(s) (LRB): VIDEO ASSISTED THORACOSCOPY (VATS)/EMPYEMA (Left) VIDEO BRONCHOSCOPY (N/A)  Subjective: Patient with incisional and pain at chest tube sites. Denies any other complaints.  Objective: Vital signs in last 24 hours: Temp:  [97.4 F (36.3 C)-99.5 F (37.5 C)] 98.7 F (37.1 C) (12/01 0345) Pulse Rate:  [72-99] 72  (12/01 0345) Cardiac Rhythm:  [-] Normal sinus rhythm (12/01 0345) Resp:  [13-22] 13  (12/01 0400) BP: (92-148)/(56-96) 92/56 mmHg (12/01 0740) SpO2:  [91 %-100 %] 95 % (12/01 0400) Arterial Line BP: (80-141)/(45-74) 80/58 mmHg (12/01 0740) FiO2 (%):  [0 %-24 %] 24 % (12/01 0400)     Intake/Output from previous day: 11/30 0701 - 12/01 0700 In: 2300 [P.O.:600; I.V.:1700] Out: 4075 [Urine:3525; Blood:60; Chest Tube:490]   Physical Exam:  Cardiovascular: RRR Pulmonary: Diminished at left base; no rales, wheezes, or rhonchi. Abdomen: Soft, non tender, bowel sounds present. Extremities: Trace bilateral lower extremity edema. Wounds: Dressing is clean and dry.  Chest Tubes: no air leak  Lab Results: CBC: Basename 12/28/11 0500 12/27/11 0048  WBC 17.9* 23.6*  HGB 9.9* 10.5*  HCT 30.5* 31.3*  PLT 302 290   BMET:  Basename 12/28/11 0500 12/27/11 0048  NA 133* 133*  K 4.4 3.9  CL 98 98  CO2 22 24  GLUCOSE 92 123*  BUN 12 15  CREATININE 0.99 1.07  CALCIUM 8.4 8.8    PT/INR:  Basename 12/27/11 0048  LABPROT 15.4*  INR 1.24   ABG:  INR: Will add last result for INR, ABG once components are confirmed Will add last 4 CBG results once components are confirmed  Assessment/Plan:  1.  Pulmonary - Chest tubes with 490 cc of output last 24 hours. CXR this am shows questionable trace left apical ptx, small left pleural effusion, and right base atelectasis.  2.Leukocytosis-WBC decreasing. On Azithromycin and Zosyn for  PNA 3.H and H this am 9.9 and 30.5  4. Remove a line 5.Will remove foley in am  ZIMMERMAN,DONIELLE MPA-C 12/28/2011,7:52 AM    I have seen and examined the patient and agree with the assessment and plan as outlined.  Elide Stalzer H 12/28/2011 10:18 AM
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
## 2011-12-29 ENCOUNTER — Inpatient Hospital Stay (HOSPITAL_COMMUNITY): Payer: BC Managed Care – PPO

## 2011-12-29 ENCOUNTER — Encounter (HOSPITAL_COMMUNITY): Payer: Self-pay | Admitting: Thoracic Surgery (Cardiothoracic Vascular Surgery)

## 2011-12-29 LAB — COMPREHENSIVE METABOLIC PANEL
BUN: 11 mg/dL (ref 6–23)
CO2: 25 mEq/L (ref 19–32)
Chloride: 97 mEq/L (ref 96–112)
Creatinine, Ser: 1.06 mg/dL (ref 0.50–1.35)
GFR calc non Af Amer: 76 mL/min — ABNORMAL LOW (ref >90)
Total Bilirubin: 0.6 mg/dL (ref 0.3–1.2)

## 2011-12-29 LAB — CBC
Platelets: 404 10*3/uL — ABNORMAL HIGH (ref 150–400)
RBC: 3.25 MIL/uL — ABNORMAL LOW (ref 4.22–5.81)
WBC: 17.9 10*3/uL — ABNORMAL HIGH (ref 4.0–10.5)

## 2011-12-29 LAB — PATHOLOGIST SMEAR REVIEW

## 2011-12-29 MED ORDER — SODIUM CHLORIDE 0.9 % IV SOLN
3.0000 g | Freq: Four times a day (QID) | INTRAVENOUS | Status: DC
  Administered 2011-12-29 – 2012-01-01 (×11): 3 g via INTRAVENOUS
  Filled 2011-12-29 (×14): qty 3

## 2011-12-29 NOTE — Progress Notes (Addendum)
                    [address]            [place] [address]          [phone]     2 Days Post-Op Procedure(s) (LRB): VIDEO ASSISTED THORACOSCOPY (VATS)/EMPYEMA (Left) VIDEO BRONCHOSCOPY (N/A)  Subjective: Sore at CT site, and some hoarseness.  Overall, doing well.  Appetite good, breathing stable.  Walking in hall without difficulty.   Objective: Vital signs in last 24 hours: Patient Vitals for the past 24 hrs:  BP Temp Temp src Pulse Resp SpO2  12/29/11 0733 104/58 mmHg 98.2 F (36.8 C) Oral 70  19  97 %  12/29/11 0416 111/61 mmHg 98.3 F (36.8 C) Oral 81  16  98 %  12/29/11 0400 - - - - 18  99 %  12/29/11 0010 - - - - - 97 %  12/29/11 0003 99/59 mmHg 99.9 F (37.7 C) Oral 73  18  96 %  12/29/11 0000 - - - - 18  97 %  12/28/11 1951 - - - - - 95 %  12/28/11 1950 - - - - 15  96 %  12/28/11 1949 94/70 mmHg 100.4 F (38 C) Oral 76  15  96 %  12/28/11 1617 - - - - - 95 %  12/28/11 1548 - 98.6 F (37 C) Oral - - -  12/28/11 1117 111/69 mmHg 97.6 F (36.4 C) Oral - - -  12/28/11 0847 - - - - - 95 %   Current Weight  12/25/11 188 lb 4.4 oz (85.4 kg)     Intake/Output from previous day: 12/01 0701 - 12/02 0700 In: 360 [P.O.:360] Out: 3140 [Urine:2875; Chest Tube:265]    PHYSICAL EXAM:  Heart: RRR Lungs: Diminished BS on L Wound: Clean and dry Chest tube: no air leak noted    Lab Results: CBC: Basename 12/29/11 0500 12/28/11 0500  WBC 17.9* 17.9*  HGB 10.5* 9.9*  HCT 32.0* 30.5*  PLT 404* 302   BMET:  Basename 12/29/11 0500 12/28/11 0500  NA 136 133*  K 3.8 4.4  CL 97 98  CO2 25 22  GLUCOSE 92 92  BUN 11 12  CREATININE 1.06 0.99  CALCIUM 8.7 8.4    PT/INR:  Basename 12/27/11 0048  LABPROT 15.4*  INR 1.24   CXR: IMPRESSION:  1. Left-sided chest tubes are not significantly changed in  position. Continued left pleural thickening and adjacent airspace  opacity with peripheral left mid thoracic low density which could    represent a aerated lung or a small amount of gas in the pleural  space.  2. Mild atelectasis along the right hemidiaphragm.      Assessment/Plan: S/P Procedure(s) (LRB): VIDEO ASSISTED THORACOSCOPY (VATS)/EMPYEMA (Left) VIDEO BRONCHOSCOPY (N/A)  ID- cx's negative so far.  Continue Zosyn/Zithromax.  WBC stable.    Pulm- continue IS, pulm toilet.  CT without air leak.  Drainage decreasing.  Continue CTs to suction for now.  Hopefully can start to d/c CTs vs decrease suction soon.  Ambulate in halls.   LOS: 4 days    COLLINS,GINA H 12/29/2011    I have seen and examined the patient and agree with the assessment and plan as outlined.  Will start to sequentially remove tubes tomorrow if he continues stable  Gurtej Noyola H 12/29/2011 5:57 PM

## 2011-12-29 NOTE — Anesthesia Postprocedure Evaluation (Signed)
  Anesthesia Post-op Note  Patient: Charles Dawson  Procedure(s) Performed: Procedure(s) (LRB) with comments: VIDEO ASSISTED THORACOSCOPY (VATS)/EMPYEMA (Left) VIDEO BRONCHOSCOPY (N/A)  Patient Location: Nursing Unit  Anesthesia Type:General  Level of Consciousness: awake, oriented and patient cooperative  Airway and Oxygen Therapy: Patient Spontanous Breathing  Post-op Pain: mild  Post-op Assessment: Post-op Vital signs reviewed, Patient's Cardiovascular Status Stable and Respiratory Function Stable  Post-op Vital Signs: Reviewed and stable  Complications: No apparent anesthesia complications
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
## 2011-12-30 ENCOUNTER — Inpatient Hospital Stay (HOSPITAL_COMMUNITY): Payer: BC Managed Care – PPO

## 2011-12-30 DIAGNOSIS — D649 Anemia, unspecified: Secondary | ICD-10-CM

## 2011-12-30 LAB — TOTAL BILIRUBIN, BODY FLUID

## 2011-12-30 LAB — CBC
Hemoglobin: 9.8 g/dL — ABNORMAL LOW (ref 13.0–17.0)
MCHC: 32.7 g/dL (ref 30.0–36.0)
RBC: 3.05 MIL/uL — ABNORMAL LOW (ref 4.22–5.81)
WBC: 16.2 10*3/uL — ABNORMAL HIGH (ref 4.0–10.5)

## 2011-12-30 LAB — CULTURE, RESPIRATORY W GRAM STAIN

## 2011-12-30 MED ORDER — ALBUTEROL SULFATE (5 MG/ML) 0.5% IN NEBU
2.5000 mg | INHALATION_SOLUTION | Freq: Two times a day (BID) | RESPIRATORY_TRACT | Status: DC
  Administered 2011-12-30 – 2012-01-02 (×5): 2.5 mg via RESPIRATORY_TRACT
  Filled 2011-12-30 (×6): qty 0.5

## 2011-12-30 MED ORDER — ALBUTEROL SULFATE (5 MG/ML) 0.5% IN NEBU: 2.5000 mg | INHALATION_SOLUTION | RESPIRATORY_TRACT | Status: DC | PRN
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
## 2011-12-30 NOTE — Progress Notes (Signed)
Internal Medicine Teaching Service Attending Note Date: 12/30/2011  Patient name: Charles Dawson  Medical record number: [id number]  Date of birth: October 27, 1955    This patient has been seen and discussed with the house staff. Please see their note for complete details. I concur with their findings. Patient is pleased with current medical care and I updated him with our most recent assessment and plan.  [name] 12/30/2011, 4:48 PM

## 2011-12-30 NOTE — Progress Notes (Signed)
Dc'ed anterior chest tube per policies and procedures, per md order. 2 chest tubes still remain. Pt tolerated well. New dressing applied. Pt in bed with call bell. Vss. Will continue to monitor.

## 2011-12-30 NOTE — Progress Notes (Addendum)
                    [address]            [place] [address]          [phone]     3 Days Post-Op Procedure(s) (LRB): VIDEO ASSISTED THORACOSCOPY (VATS)/EMPYEMA (Left) VIDEO BRONCHOSCOPY (N/A)  Subjective: OOB in chair.  Still sore, but otherwise, no complaints.   Objective: Vital signs in last 24 hours: Patient Vitals for the past 24 hrs:  BP Temp Temp src Pulse Resp SpO2  12/30/11 0735 - - - - 15  93 %  12/30/11 0329 101/57 mmHg 98.2 F (36.8 C) Oral 76  13  94 %  12/30/11 0003 108/75 mmHg 100.6 F (38.1 C) Oral 78  18  98 %  12/29/11 2027 111/59 mmHg - - 76  23  94 %  12/29/11 2016 111/59 mmHg 98.3 F (36.8 C) Oral 74  18  95 %  12/29/11 2006 - - - - 19  95 %  12/29/11 1650 - - - - - 93 %  12/29/11 1614 - - - - 18  96 %  12/29/11 1600 97/46 mmHg - - 75  18  96 %  12/29/11 1539 - 99 F (37.2 C) Oral - - -  12/29/11 1348 - - - - - 92 %  12/29/11 1200 110/64 mmHg - - 68  19  96 %  12/29/11 1113 - 98.3 F (36.8 C) Oral - - -  12/29/11 0849 - - - - 17  96 %   Current Weight  12/25/11 188 lb 4.4 oz (85.4 kg)     Intake/Output from previous day: 12/02 0701 - 12/03 0700 In: 600.7 [I.V.:300.7; IV Piggyback:300] Out: 3630 [Urine:3500; Chest Tube:130]    PHYSICAL EXAM:  Heart: RRR Lungs: Decreased BS on L Wound: Clean and dry Chest tube: no air leak noted    Lab Results: CBC: Basename 12/30/11 0515 12/29/11 0500  WBC 16.2* 17.9*  HGB 9.8* 10.5*  HCT 30.0* 32.0*  PLT 456* 404*   BMET:  Basename 12/29/11 0500 12/28/11 0500  NA 136 133*  K 3.8 4.4  CL 97 98  CO2 25 22  GLUCOSE 92 92  BUN 11 12  CREATININE 1.06 0.99  CALCIUM 8.7 8.4    PT/INR: No results found for this basename: LABPROT,INR in the last 72 hours    Assessment/Plan: S/P Procedure(s) (LRB): VIDEO ASSISTED THORACOSCOPY (VATS)/EMPYEMA (Left) VIDEO BRONCHOSCOPY (N/A) ID- Abx regimen pared down to Unasyn yesterday.  Hope to transition to po Augmentin prior to  d/c.  WBC trending down.   Pulm toilet, ambulation. CT without air leak. Drainage decreasing. Plan d/c 1 CT today.  Will place remaining CT to water seal.   LOS: 5 days    COLLINS,GINA H 12/30/2011   I have seen and examined the patient and agree with the assessment and plan as outlined.  D/C anterior tube.  OWEN,CLARENCE H 12/30/2011 8:17 AM

## 2011-12-30 NOTE — Progress Notes (Signed)
Utilization review completed.  

## 2011-12-31 ENCOUNTER — Inpatient Hospital Stay (HOSPITAL_COMMUNITY): Payer: BC Managed Care – PPO

## 2011-12-31 LAB — BODY FLUID CULTURE: Culture: NO GROWTH

## 2011-12-31 LAB — CBC
HCT: 27.3 % — ABNORMAL LOW (ref 39.0–52.0)
MCV: 97.8 fL (ref 78.0–100.0)
RBC: 2.79 MIL/uL — ABNORMAL LOW (ref 4.22–5.81)
RDW: 13.3 % (ref 11.5–15.5)

## 2011-12-31 LAB — CULTURE, BLOOD (ROUTINE X 2): Culture: NO GROWTH
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
## 2011-12-31 NOTE — Progress Notes (Signed)
Removed posterior chest tube per policies and procedures and md order. Pt tolerated well. New dressing applied. Will continue to monitor.

## 2011-12-31 NOTE — Progress Notes (Addendum)
                    [address]            [place] [address]          [phone]     4 Days Post-Op Procedure(s) (LRB): VIDEO ASSISTED THORACOSCOPY (VATS)/EMPYEMA (Left) VIDEO BRONCHOSCOPY (N/A)  Subjective: Feels well, no complaints.  Less sore since CT removed.    Objective: Vital signs in last 24 hours: Patient Vitals for the past 24 hrs:  BP Temp Temp src Pulse Resp SpO2  12/31/11 0735 - - - - 17  95 %  12/31/11 0400 101/49 mmHg 99.8 F (37.7 C) Oral 71  18  100 %  12/31/11 0315 - - - 98  18  98 %  12/31/11 0000 114/68 mmHg 99.8 F (37.7 C) Oral 75  18  96 %  12/30/11 2048 112/66 mmHg - - 76  23  97 %  12/30/11 2000 112/66 mmHg 98.7 F (37.1 C) Oral 74  15  100 %  12/30/11 1926 - - - - 19  98 %  12/30/11 1546 101/51 mmHg 98.7 F (37.1 C) Oral 69  15  95 %  12/30/11 1200 104/66 mmHg 98.6 F (37 C) Oral 74  15  96 %  12/30/11 1159 - 98.6 F (37 C) Oral - 18  95 %   Current Weight  12/25/11 188 lb 4.4 oz (85.4 kg)     Intake/Output from previous day: 12/03 0701 - 12/04 0700 In: 319.5 [I.V.:19.5; IV Piggyback:300] Out: 2450 [Urine:2450]    PHYSICAL EXAM:  Heart: RRR Lungs: Decreased BS L base Wound: Clean and dry Chest tube: no air leak    Lab Results: CBC: Basename 12/31/11 0510 12/30/11 0515  WBC 15.9* 16.2*  HGB 9.0* 9.8*  HCT 27.3* 30.0*  PLT 504* 456*   BMET:  Basename 12/29/11 0500  NA 136  K 3.8  CL 97  CO2 25  GLUCOSE 92  BUN 11  CREATININE 1.06  CALCIUM 8.7    PT/INR: No results found for this basename: LABPROT,INR in the last 72 hours    Assessment/Plan: S/P Procedure(s) (LRB): VIDEO ASSISTED THORACOSCOPY (VATS)/EMPYEMA (Left) VIDEO BRONCHOSCOPY (N/A) ID- WBC continues to trend down. No fever.  Unasyn D#3/6. Continue pulm toilet, ambulation.  Will d/c posterior CT today, continue remaining CT to water seal.   LOS: 6 days    COLLINS,GINA H 12/31/2011    I have seen and examined the patient and  agree with the assessment and plan as outlined.  D/C last tube in am if CXR stable and minimal tube output  Martin Smeal H 12/31/2011 8:46 AM

## 2012-01-01 ENCOUNTER — Inpatient Hospital Stay (HOSPITAL_COMMUNITY): Payer: BC Managed Care – PPO

## 2012-01-01 LAB — CBC
Hemoglobin: 9.4 g/dL — ABNORMAL LOW (ref 13.0–17.0)
MCH: 31.6 pg (ref 26.0–34.0)
MCHC: 32.1 g/dL (ref 30.0–36.0)
MCV: 98.7 fL (ref 78.0–100.0)

## 2012-01-01 LAB — CULTURE, BLOOD (ROUTINE X 2)

## 2012-01-01 MED ORDER — AMOXICILLIN-POT CLAVULANATE 875-125 MG PO TABS
1.0000 | ORAL_TABLET | Freq: Two times a day (BID) | ORAL | Status: DC
  Administered 2012-01-01 – 2012-01-02 (×2): 1 via ORAL
  Filled 2012-01-01 (×4): qty 1

## 2012-01-01 MED ORDER — HYDROMORPHONE HCL 2 MG PO TABS
2.0000 mg | ORAL_TABLET | Freq: Four times a day (QID) | ORAL | Status: DC
  Administered 2012-01-01 – 2012-01-02 (×4): 2 mg via ORAL
  Filled 2012-01-01 (×4): qty 1

## 2012-01-01 MED ORDER — SODIUM CHLORIDE 0.9 % IJ SOLN
INTRAMUSCULAR | Status: AC
  Filled 2012-01-01: qty 10
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
## 2012-01-01 NOTE — Progress Notes (Signed)
Patient last chest tube was d'cd per MD order. Patient tolerated procedure well. Will continue to monitor.

## 2012-01-01 NOTE — Progress Notes (Addendum)
                    [address]            [place] [address]          [phone]     5 Days Post-Op Procedure(s) (LRB): VIDEO ASSISTED THORACOSCOPY (VATS)/EMPYEMA (Left) VIDEO BRONCHOSCOPY (N/A)  Subjective: Feels much better overall.  Still sore when deep breathing.    Objective: Vital signs in last 24 hours: Patient Vitals for the past 24 hrs:  BP Temp Temp src Pulse Resp SpO2  01/01/12 0400 131/66 mmHg 99.6 F (37.6 C) Oral 72  18  97 %  01/01/12 0008 115/68 mmHg 98.8 F (37.1 C) Oral 77  17  96 %  12/31/11 2016 - - - - - 99 %  12/31/11 1943 115/67 mmHg 98.8 F (37.1 C) Oral - - 95 %  12/31/11 1600 - 98.5 F (36.9 C) Oral - - -  12/31/11 1537 - - - - 18  96 %  12/31/11 1535 123/76 mmHg 98.5 F (36.9 C) Oral 66  18  96 %  12/31/11 1244 - - - - 18  98 %  12/31/11 1145 - - - - 16  99 %  12/31/11 1126 124/70 mmHg 98 F (36.7 C) Oral 74  21  98 %  12/31/11 1123 - - - - 21  -  12/31/11 0820 - - - - 23  92 %   Current Weight  12/25/11 188 lb 4.4 oz (85.4 kg)     Intake/Output from previous day: 12/04 0701 - 12/05 0700 In: 1694.6 [P.O.:840; I.V.:454.6; IV Piggyback:400] Out: 2700 [Urine:2700]    PHYSICAL EXAM:  Heart: RRR Lungs: Diminished BS in left base Wound: Clean and dry Chest tube: no air leak    Lab Results: CBC: Basename 01/01/12 0445 12/31/11 0510  WBC 14.1* 15.9*  HGB 9.4* 9.0*  HCT 29.3* 27.3*  PLT 597* 504*   BMET: No results found for this basename: NA:2,K:2,CL:2,CO2:2,GLUCOSE:2,BUN:2,CREATININE:2,CALCIUM:2 in the last 72 hours  PT/INR: No results found for this basename: LABPROT,INR in the last 72 hours   CXR: No obvious ptx    Assessment/Plan: S/P Procedure(s) (LRB): VIDEO ASSISTED THORACOSCOPY (VATS)/EMPYEMA (Left) VIDEO BRONCHOSCOPY (N/A) ID- WBC continues to trend down. No fever. Unasyn D# 4/6. Hopefully can switch to po abx soon. Continue pulm toilet, ambulation.  Remaining CT with no air leak, scant  output.  CXR stable.  Will d/c CT this am. If pt remains stable, should be ready for discharge from our standpoint in next 1-2 days.     LOS: 7 days    COLLINS,GINA H 01/01/2012    I have seen and examined the patient and agree with the assessment and plan as outlined.  Maxim Bedel H 01/01/2012 9:31 AM

## 2012-01-01 NOTE — Progress Notes (Signed)
Internal Medicine Teaching Service Attending Note Date: 01/01/2012  Patient name: Charles Dawson  Medical record number: [id number]  Date of birth: [birth year]    This patient has been seen and discussed with the house staff. Please see their note for complete details. I concur with their findings with the following additions/corrections: Mr. [name] complaining of not enough sleep to me. He thinks that the pain medication that we're giving him is probably too much for him. Surgery plans to take out the third chest tube today. We will observe him tonight and most likely will discharge him tomorrow if every thing remains stable. Change IV antibiotics to PO.  [name] 01/01/2012, 9:40 AM

## 2012-01-02 ENCOUNTER — Inpatient Hospital Stay (HOSPITAL_COMMUNITY): Payer: BC Managed Care – PPO

## 2012-01-02 DIAGNOSIS — J869 Pyothorax without fistula: Principal | ICD-10-CM

## 2012-01-02 LAB — CBC
HCT: 29.7 % — ABNORMAL LOW (ref 39.0–52.0)
MCH: 32.1 pg (ref 26.0–34.0)
MCHC: 32.7 g/dL (ref 30.0–36.0)
MCV: 98.3 fL (ref 78.0–100.0)
Platelets: 724 10*3/uL — ABNORMAL HIGH (ref 150–400)
RDW: 13.2 % (ref 11.5–15.5)
WBC: 13.6 10*3/uL — ABNORMAL HIGH (ref 4.0–10.5)

## 2012-01-02 MED ORDER — AMOXICILLIN-POT CLAVULANATE 875-125 MG PO TABS: 1.0000 | ORAL_TABLET | Freq: Two times a day (BID) | ORAL | Status: AC

## 2012-01-02 MED ORDER — OXYCODONE-ACETAMINOPHEN 7.5-325 MG PO TABS: 1.0000 | ORAL_TABLET | Freq: Three times a day (TID) | ORAL | Status: DC | PRN

## 2012-01-02 NOTE — Discharge Summary (Signed)
Internal Medicine Teaching [facility] Discharge Note  Name: Charles Dawson MRN: [id number] DOB: Oct 26, 1955 56 y.o.  Date of Admission: 12/25/2011  7:34 AM Date of Discharge: 01/02/2012 Attending Physician: [name], MD  Discharge Diagnosis: Principal Problem:  *Empyema Active Problems:  CAP (community acquired pneumonia)  Discharge Medications:   Medication List     As of 01/04/2012  9:59 AM    STOP taking these medications         HYDROcodone-acetaminophen 10-325 MG per tablet   Commonly known as: NORCO      TAKE these medications         ALPRAZolam 0.5 MG tablet   Commonly known as: XANAX   Take 0.5 mg by mouth 3 (three) times daily.      amoxicillin-clavulanate 875-125 MG per tablet   Commonly known as: AUGMENTIN   Take 1 tablet by mouth 2 (two) times daily with a meal.      aspirin 81 MG chewable tablet   Chew 324 mg by mouth once.      oxyCODONE-acetaminophen 7.5-325 MG per tablet   Commonly known as: PERCOCET   Take 1 tablet by mouth every 8 (eight) hours as needed for pain.         Disposition and follow-up:   Mr.Dariyon D Provencal was discharged from [facility] in stable condition.  At the hospital follow up visit please address: -PNA/empyema: f/u cxr 6 weeks, completion of course of abx--anticipated d/c date of Augmentin 01/22/12.  Follow up with CVTS as scheduled  Follow-up Appointments: Follow-up Information    Follow up with Lonia Blood, MD. Call on 02/02/2012. (@3pm )    Contact information:   [address] [place] [address] [phone]       Follow up with Purcell Nails, MD. On 01/26/2012. (Have a chest x-ray at 12:00, then see Dr. Orvan July PA at 1:00)    Contact information:   [address] [place] [address] [phone]       Follow up with [facility] [place] [facility]. On 01/08/2012. (At 10:30 for suture removal)          Consultations: CVTS Treatment Team:  Purcell Nails, MD  Procedures  Performed:  Dg Chest 2 View  01/01/2012  *RADIOLOGY REPORT*  Clinical Data: Evaluate pneumonia and empyema  CHEST - 2 VIEW  Comparison: 12/31/2011; 12/30/2011; 12/29/2011:  Chest CT - 12/26/2011  Findings: Grossly unchanged cardiac silhouette and mediastinal contours with persistent obscuration of left heart border secondary to grossly unchanged left basilar heterogeneous air space opacities. Interval removal of apically directed left-sided chest tube.  Left basilar chest tube is unchanged.  Loculated left basilar hydropneumothorax is grossly unchanged. The right hemithorax is unchanged.  Unchanged bones.  IMPRESSION: 1.  No significant change in left basilar loculated hydropneumothorax post removal of previously noted apically directed left sided chest tube.  Remaining left basilar heterogeneous opacities are unchanged. 2.  Grossly unchanged left mid and lower lung heterogeneous air space opacities.  No new focal airspace opacity.   Original Report Authenticated By: Tacey Ruiz, MD    Dg Chest 2 View  12/31/2011  *RADIOLOGY REPORT*  Clinical Data: Empyema.  CHEST - 2 VIEW  Comparison: 12/03 and 12/26/2011  Findings: One of the three left-sided chest tubes has been removed. No pneumothorax.  There is improved aeration at the left lung base. Small amount of loculated fluid with air-fluid levels is seen laterally.  Right lung is clear.  Heart size and  vascularity are normal.  IMPRESSION: Improved aeration at the left lung base.  Small residual loculated empyema.   Original Report Authenticated By: Francene Boyers, M.D.    Dg Chest 2 View  12/26/2011  *RADIOLOGY REPORT*  Clinical Data: Chest pain and cough.  CHEST - 2 VIEW  Comparison: 12/25/2011 radiographs and CT.  Findings: Left pleural effusion has enlarged, now moderate in size. There is associated increased air space disease in the left lower lobe.  Patchy right basilar opacity appears unchanged.  There is no significant right-sided pleural effusion.   Visualized heart size and mediastinal contours are stable.  IMPRESSION:  1.  Enlarging left pleural effusion. 2.  Worsening left lower lobe air space disease suspicious for pneumonia as correlated with recent CT.   Original Report Authenticated By: Carey Bullocks, M.D.    Dg Chest 2 View  12/25/2011  *RADIOLOGY REPORT*  Clinical Data: Left posterior rib pain of concern for pneumothorax. Recent fall.  CHEST - 2 VIEW  Comparison: None.  Findings: Stable cardiac silhouette.  There is bibasilar atelectasis versus air space disease.  There is elevation of the left hemidiaphragm.  No pneumothorax.  No evidence of rib fracture.  IMPRESSION:  1.  Bibasilar atelectasis versus infiltrates. 2.  Elevation left hemidiaphragm. 3.  No evidence of  pneumothorax. 4.  No rib fracture evident.   Original Report Authenticated By: Genevive Bi, M.D.    Ct Chest W Contrast  12/26/2011  *RADIOLOGY REPORT*  Clinical Data: Worsening left pleural effusion, evaluate for empyema  CT CHEST WITH CONTRAST  Technique:  Multidetector CT imaging of the chest was performed following the standard protocol during bolus administration of intravenous contrast.  Contrast: 80mL OMNIPAQUE IOHEXOL 300 MG/ML  SOLN  Comparison: CTA chest dated 12/17/2011.  Findings: Moderate to large left pleural effusion, increased. Effusion is partially loculated and notable for enhancement of the parietal pleura inferiorly (series 2/image 43), indicating that it is at least exudative.  Associated small amount of epicardial fluid/stranding along the left heart border/ superior mediastinum (series 2/image 36).  Volume loss/consolidation in the lingula and left lower lobe, some which may reflect compressive atelectasis, although left lower lobe pneumonia is suspected when correlating with recent CT.  Additional patchy opacity at the right lung base (series 3/image 42), suspicious for pneumonia.  Mild centrilobular emphysematous changes.  No pneumothorax.  Visualized  thyroid is unremarkable.  The heart is top normal in size.  Coronary atherosclerosis.  No suspicious mediastinal, hilar, or axillary lymphadenopathy.  Visualized upper abdomen is unremarkable.  Mild degenerative changes of the visualized thoracolumbar spine.  IMPRESSION: Complex/partially loculated moderate to large left pleural effusion, increased, with associated findings suggesting an exudative effusion.  In the appropriate clinical setting, an empyema cannot be excluded.  Associated small amount of epicardial fluid/stranding along the left heart border/ superior mediastinum.  Suspected bilateral lower lobe pneumonia, left greater than right.   Original Report Authenticated By: Charline Bills, M.D.    Ct Angio Chest W/cm &/or Wo Cm  12/25/2011  *RADIOLOGY REPORT*  Clinical Data: Post fall 1 week ago, now with left-sided rib pain, cough, shortness of breath, fever  CT ANGIOGRAPHY CHEST  Technique:  Multidetector CT imaging of the chest using the standard protocol during bolus administration of intravenous contrast. Multiplanar reconstructed images including MIPs were obtained and reviewed to evaluate the vascular anatomy.  Contrast: OMNIPAQUE IOHEXOL 350 MG/ML SOLN  Comparison: Chest radiograph - earlier same day  Vascular Findings:  Suboptimal opacification of the pulmonary arterial  system with the main pulmonary artery measuring only 200 HU.  Given this limitation, there are no discrete filling defects within the pulmonary arterial tree to the level of the segmental branches. Evaluation of the distal subsegmental pulmonary arteries is degraded secondary to suboptimal vessel opacification and patient motion artifact.  Borderline enlargement of the main pulmonary artery measuring 3.4 cm in greatest transverse axial dimension (image 124, series six).  Normal heart size.  Coronary artery calcifications.  No pericardial effusion. Normal caliber of the thoracic aorta.  Conventional configuration of the  aortic arch.  A minimal amount of pleural fluid layers adjacent to the descending thoracic aorta.  No periaortic stranding or evidence of thoracic aortic dissection.  Nonvascular findings:  Evaluation of the pulmonary parenchyma is minimally degraded secondary to patient respiratory artifact.  Small left-sided pleural effusion with left basilar heterogeneous air space opacities with scattered air bronchograms, worrisome for infection.  Heterogeneous opacities, some of which appear to have a tree in bud configuration within the right lower lung (images 72 and 77, series five) are also worrisome for infection.  Centrilobular emphysema.  No pneumothorax.  Central airways are patent.  Scattered shoddy mediastinal lymph nodes are not enlarged by CT criteria.  No axillary adenopathy.  Incidental imaging of the upper abdomen is normal.  No acute or aggressive osseous abnormalities, specifically, no displaced left-sided rib fractures.  IMPRESSION:  1.  No definite evidence of pulmonary embolism to the level of the bilateral subsegmental pulmonary arteries. 2.  Small left-sided effusion with bibasilar heterogeneous airspace opacities, left greater than right, worrisome for multifocal infection, less likely aspiration.  A follow-up chest radiograph in 4 to 6 weeks after treatment is recommended to ensure resolution.  3.  Mild centrilobular emphysematous change. 4.  Coronary artery calcifications. Borderline enlargement of the main pulmonary artery.  Further evaluation with cardiac echo may be performed as clinically indicated. 5.  No acute osseous abnormalities, specifically, no displaced left- sided rib fractures.   Original Report Authenticated By: Tacey Ruiz, MD    Dg Chest Bilateral Decubitus  12/26/2011  *RADIOLOGY REPORT*  Clinical Data: Evaluate for empyema.  CHEST - BILATERAL DECUBITUS VIEW  Comparison: 12/26/2011  Findings: Bilateral decubitus images of the chest were obtained. The left pleural fluid is  layering.  There is evidence for consolidation or airspace disease at the left lung base.  IMPRESSION: Consolidation or airspace disease at the left lung base.  A component of the left pleural fluid is free-flowing or layering.   Original Report Authenticated By: Richarda Overlie, M.D.    Dg Chest Port 1 View  12/30/2011  *RADIOLOGY REPORT*  Clinical Data: Left-sided empyema, follow-up  PORTABLE CHEST - 1 VIEW  Comparison: Portable chest x-ray of 12/29/2011  Findings: Left chest tubes are unchanged in position and no pneumothorax is seen.  Pleural and parenchymal opacity at the left lung base is unchanged.  Probable mild atelectasis at the right lung base remains.  Cardiomegaly is stable.  IMPRESSION: No change in pleural and parenchymal opacity at the left lung base with left chest tubes.   Original Report Authenticated By: Dwyane Dee, M.D.    Dg Chest Port 1 View  12/29/2011  *RADIOLOGY REPORT*  Clinical Data: Chest tube.  Empyema.  PORTABLE CHEST - 1 VIEW  Comparison: 12/28/2011  Findings: Three left-sided chest tubes remain in place.  Continued left pleural thickening noted with adjacent airspace opacity and low density peripherally in the left mid thorax.  Mild cardiomegaly.  Mild atelectasis along  the right hemidiaphragm.  IMPRESSION:  1.  Left-sided chest tubes are not significantly changed in position.  Continued left pleural thickening and adjacent airspace opacity with peripheral left mid thoracic low density which could represent a aerated lung or a small amount of gas in the pleural space. 2.  Mild atelectasis along the right hemidiaphragm.   Original Report Authenticated By: Gaylyn Rong, M.D.    Dg Chest Port 1 View  12/28/2011  *RADIOLOGY REPORT*  Clinical Data: Follow up left pneumothorax with chest tubes in place.  PORTABLE CHEST - 1 VIEW  Comparison: Portable chest x-ray yesterday and CT chest 12/26/2011.  Findings: 3 left chest tubes in place with interval resolution of the lateral left  pneumothorax.  Persistent dense consolidation in the left lung base. No evidence of residual left pleural effusion. Lungs otherwise clear.  Cardiac silhouette upper normal to slightly enlarged for AP portable technique, unchanged.  Pulmonary vascularity normal.  IMPRESSION: Resolution of the left lateral pneumothorax since yesterday.  No residual left pleural effusion.  Persistent dense atelectasis and/or pneumonia at the left lung base.  No new abnormalities.   Original Report Authenticated By: Hulan Saas, M.D.    Dg Chest Portable 1 View  12/27/2011  *RADIOLOGY REPORT*  Clinical Data: Postop for central line removal and chest tube placement.  PORTABLE CHEST - 1 VIEW  Comparison: CT chest of contrast 12/26/2011 chest radiograph 12/26/2011  Findings: Read left-sided chest tubes have been placed, two of which terminate near the left lung apex, and one of which terminates at the medial left lung base.  There has been a significant decrease in size of the left pleural fluid collection. At the lateral left lung base, a small amount of pleural air cannot be excluded, but is not definite.  There is subcutaneous gas along the lateral left chest wall.  Patchy opacities at the right lung base could reflect airspace disease or atelectasis.  Heart size appears stable.  IMPRESSION:  1.  Status post placement of three left-sided chest tubes, with significant decrease in size of the left pleural fluid collection. 2.  Small amount of pleural air on the left cannot be excluded, but is not definite.  There is some subcutaneous gas in the left chest wall. 3.  Patchy opacity right lung base (airspace disease verses atelectasis).   Original Report Authenticated By: Britta Mccreedy, M.D.    Dg Abd Acute W/chest  12/26/2011  *RADIOLOGY REPORT*  Clinical Data: Left flank pain.  ACUTE ABDOMEN SERIES (ABDOMEN 2 VIEW & CHEST 1 VIEW)  Comparison: 12/25/2011 and 12/26/2011  Findings: The chest radiograph again demonstrates a left  pleural effusion with left basilar consolidation or atelectasis.  There is no evidence for a pneumothorax.  The heart is obscured by the left basilar lung densities.  There is a nonspecific bowel gas pattern. There appears to be gas within the small and large bowel.  There is stool in the right lower quadrant of the abdomen. The liver appears prominent.  No evidence for free air.  IMPRESSION: Persistent left basilar lung densities are suggestive for pleural fluid and consolidation.  Nonspecific bowel gas pattern.  Possible hepatomegaly.   Original Report Authenticated By: Richarda Overlie, M.D.    Admission HPI: 56yo M with no PMH presents to the ED 1 week s/p fall onto his left side w/o any pain initially; however, this morning he woke up this morning around 7am c/o left sided pain, that is sharp, constant, and worse on deep inspiration and with  any movement. Nothing improves his pain, even narcotics given in the ED. He also endorses mild SOB, and a dry cough that has persisted for 2 weeks from an URI. He states that he was up all night last night 2/2 dry cough. He denies any hemotysis, fevers, chills, dizziness, syncope, headaches, changes in bowel habits, or urinary urgency, hesitancy or dysuria.   Hospital Course by problem list:  Community acquired pneumonia with empyema--presented with complaints of severe left sided chest pain and shortness of breath with cough.  CT angio chest revealed a small left sided effusion and bibasilar heterogenous airspace opacities, L>R, worrisome for multifocal infection. Rocephin and Azithromycin started in the ED. Further imaging and evaluation during hospital course showed increased size of pleural effusion on the left associated with left lower lobe collapse.  CVTS was consulted who recommended thoracoscopic drainage. Video assisted thoracoscopy was done on 12/27/11.  Chest tubes were eventually discontinued one at a time during hospital course.   Influenza panel, legionella AG,  strep pneumo AG & HIV AB negative.  Pleural fluid cultures showed no growth.  Blood cultures were also drawn and 1/2 Grew Bacillus species 12/26/11 that was thought to likely be contaminant after discussion with microbiology lab and Infectious Disease. Antibiotics were transitioned to IV Unasyn and eventually transitioned to Augmentin PO that will be continued as an outpatient for total 4 weeks treatment.  Mr. Custis's breathing and pain significantly improved throughout hospital course.  He will need to follow up with his pcp and CVTS as an outpatient.    Discharge Vitals:  BP 124/84  Pulse 75  Temp 98.3 F (36.8 C) (Oral)  Resp 18  Ht 5\' 10"  (1.778 m)  Wt 188 lb 4.4 oz (85.4 kg)  BMI 27.01 kg/m2  SpO2 95%  Discharge Labs:  Results for orders placed during the hospital encounter of 12/25/11 (from the past 24 hour(s))  CBC     Status: Abnormal   Collection Time   01/02/12  5:10 AM      Component Value Range   WBC 13.6 (*) 4.0 - 10.5 K/uL   RBC 3.02 (*) 4.22 - 5.81 MIL/uL   Hemoglobin 9.7 (*) 13.0 - 17.0 g/dL   HCT 16.1 (*) 09.6 - 04.5 %   MCV 98.3  78.0 - 100.0 fL   MCH 32.1  26.0 - 34.0 pg   MCHC 32.7  30.0 - 36.0 g/dL   RDW 40.9  81.1 - 91.4 %   Platelets 724 (*) 150 - 400 K/uL   Signed: Darden Palmer 01/04/2012, 10:16 AM   Time Spent on Discharge: 45 minutes Services Ordered on Discharge: none Equipment Ordered on Discharge: none

## 2012-01-02 NOTE — Progress Notes (Signed)
                    [address]            [place] [address]          [phone]     6 Days Post-Op Procedure(s) (LRB): VIDEO ASSISTED THORACOSCOPY (VATS)/EMPYEMA (Left) VIDEO BRONCHOSCOPY (N/A)  Subjective: Feels well, no complaints.   Objective: Vital signs in last 24 hours: Patient Vitals for the past 24 hrs:  BP Temp Temp src Pulse Resp SpO2  01/02/12 0752 - - - - - 95 %  01/02/12 0732 124/84 mmHg 98.3 F (36.8 C) Oral - 18  -  01/02/12 0615 - - - - 18  -  01/02/12 0400 132/67 mmHg 98.3 F (36.8 C) Oral 75  14  97 %  01/02/12 0000 124/62 mmHg 98.5 F (36.9 C) Oral 71  23  96 %  01/01/12 2035 117/71 mmHg 99 F (37.2 C) Oral 70  15  98 %  01/01/12 1609 116/92 mmHg 98.1 F (36.7 C) Oral 71  18  94 %  01/01/12 1200 126/72 mmHg 98.8 F (37.1 C) Oral 73  - 95 %  01/01/12 1135 - 98.8 F (37.1 C) Oral - - -   Current Weight  12/25/11 188 lb 4.4 oz (85.4 kg)     Intake/Output from previous day: 12/05 0701 - 12/06 0700 In: 720 [P.O.:720] Out: 700 [Urine:700]    PHYSICAL EXAM:  Heart: RRR Lungs:decreased L base Wound: Clean and dry     Lab Results: CBC: Basename 01/02/12 0510 01/01/12 0445  WBC 13.6* 14.1*  HGB 9.7* 9.4*  HCT 29.7* 29.3*  PLT 724* 597*   BMET: No results found for this basename: NA:2,K:2,CL:2,CO2:2,GLUCOSE:2,BUN:2,CREATININE:2,CALCIUM:2 in the last 72 hours  PT/INR: No results found for this basename: LABPROT,INR in the last 72 hours  CXR: Findings: Left chest tube has been removed. Short air fluid levels  in the left chest are unchanged. Left basilar airspace opacity  persist. Right lung remains clear. Heart size normal.  IMPRESSION:  Status post removal left chest tube. No pneumothorax. Loculated  left basilar hydropneumothorax is unchanged   Assessment/Plan: S/P Procedure(s) (LRB): VIDEO ASSISTED THORACOSCOPY (VATS)/EMPYEMA (Left) VIDEO BRONCHOSCOPY (N/A) Stable from surgical standpoint. Ok to discharge  home when ok with medical service.  Will arrange outpatient follow up.   LOS: 8 days    COLLINS,GINA H 01/02/2012

## 2012-01-02 NOTE — Progress Notes (Signed)
Discussed discharge instructions and medications with patient. Patient verbalized understanding with all questions answered. VSS. Pt discharged home with nephew.  [facility]

## 2012-01-02 NOTE — Progress Notes (Signed)
Internal Medicine Teaching Service Attending Note Date: 01/02/2012  Patient name: Charles Dawson  Medical record number: [id number]  Date of birth: 10-28-1955    This patient has been seen and discussed with the house staff. Please see their note for complete details. I concur with their findings. Patient is currently ready to be discharged home on Oral antibiotics and follow up with PCP.  [name] 01/02/2012, 12:33 PM
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
## 2012-01-06 ENCOUNTER — Other Ambulatory Visit: Payer: Self-pay | Admitting: *Deleted

## 2012-01-06 DIAGNOSIS — J869 Pyothorax without fistula: Secondary | ICD-10-CM

## 2012-01-08 ENCOUNTER — Ambulatory Visit (INDEPENDENT_AMBULATORY_CARE_PROVIDER_SITE_OTHER): Payer: Self-pay

## 2012-01-08 DIAGNOSIS — Z4802 Encounter for removal of sutures: Secondary | ICD-10-CM

## 2012-01-08 DIAGNOSIS — J869 Pyothorax without fistula: Secondary | ICD-10-CM

## 2012-01-08 NOTE — Progress Notes (Signed)
Removed 3 chest tube sutures. All 3 chest tube incision sites have a slight non-approximation, there is granulating tissue present with no drainage, no redness, no swelling, no fevers. Pt denies any pain and tolerated well. He was instructed to   Clean with antibacterial soap and water, keep dry and to call office if he developed any signs of infection at sites. Appt scheduled with PA on 01/26/12, pt aware.

## 2012-01-26 ENCOUNTER — Ambulatory Visit
Admission: RE | Admit: 2012-01-26 | Discharge: 2012-01-26 | Disposition: A | Discharge status: Home / Self Care | Payer: BC Managed Care – PPO | Source: Ambulatory Visit | Attending: Thoracic Surgery (Cardiothoracic Vascular Surgery) | Admitting: Thoracic Surgery (Cardiothoracic Vascular Surgery)

## 2012-01-26 ENCOUNTER — Ambulatory Visit (INDEPENDENT_AMBULATORY_CARE_PROVIDER_SITE_OTHER): Payer: Self-pay | Admitting: Physician Assistant

## 2012-01-26 VITALS — BP 140/89 | HR 74 | Resp 16 | Ht 70.0 in | Wt 180.0 lb

## 2012-01-26 DIAGNOSIS — Z09 Encounter for follow-up examination after completed treatment for conditions other than malignant neoplasm: Secondary | ICD-10-CM

## 2012-01-26 DIAGNOSIS — J869 Pyothorax without fistula: Secondary | ICD-10-CM

## 2012-01-26 MED ORDER — OXYCODONE-ACETAMINOPHEN 7.5-325 MG PO TABS: 1.0000 | ORAL_TABLET | Freq: Three times a day (TID) | ORAL | Status: AC | PRN

## 2012-01-26 NOTE — Progress Notes (Signed)
                    [address]            [place] [address]          [phone]     HPI: Patient returns for routine postoperative follow-up having undergone a left VATS/drainage of empyema on 12/27/2011.  The patient's postoperative course was generally uneventful.  He completed a 6 day course of IV antibiotics while in the hospital and was discharged home on po Augmentin to complete a 4 week course of antibiotics.  He was discharged home on 01/01/2102 in good condition.   Since hospital discharge, the patient has continued to progress well.  He will soon complete his course of Augmentin.  He denies fevers, chills, cough or shortness of breath.  He still has incisional discomfort, but is mainly only taking his pain medication at night.  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  Overall, he is feeling much improved.    Current Outpatient Prescriptions  Medication Sig Dispense Refill  . ALPRAZolam (XANAX) 0.5 MG tablet Take 0.5 mg by mouth 3 (three) times daily.      Marland Kitchen aspirin 81 MG chewable tablet Chew 324 mg by mouth once.      Marland Kitchen oxyCODONE-acetaminophen (PERCOCET) 7.5-325 MG per tablet Take 1 tablet by mouth every 8 (eight) hours as needed for pain.  30 tablet  0     Physical Exam: BP 140/89 HR 74 Resp 16 Wounds: Left thoracotomy and chest tube sites are well healed, no erythema or drainage. Heart: regular rate and rhythm Lungs: Slightly decreased breath sounds left base, overall clear    Diagnostic Tests: Chest xray: Dg Chest 2 View  01/26/2012  *RADIOLOGY REPORT*  Clinical Data: Recent VATS for empyema.  Follow up.  CHEST - 2 VIEW  Comparison: 01/02/2012 and 01/01/2012 radiographs.  Findings: There has been interval near complete resolution of the small air fluid levels previously noted laterally in the left pleural space.  The loculated pleural effusion and left basilar pulmonary opacity appear slightly improved.  The right lung is clear.  The  heart size and mediastinal contours are stable.  IMPRESSION: Improving pleural parenchymal opacities in the left hemithorax with minimal residual pleural air.  No new findings identified.   Original Report Authenticated By: Carey Bullocks, M.D.        Assessment/Plan: Mr. Janek has progressed well status post left VATS/drainage of empyema.  His chest x-ray is improving, and he is completing his antibiotic course.  He has follow up scheduled in 2 weeks with his medical doctor.  I have given him a refill on Percocet, as he is still taking it to help him sleep.  He is stable from a surgical standpoint, and we will see him back as needed.

## 2012-04-28 ENCOUNTER — Telehealth: Payer: Self-pay | Admitting: *Deleted

## 2012-04-28 NOTE — Telephone Encounter (Signed)
Charles Dawson had surgery for a left lung infection on 12/27/11.  He calls with c/o pain on that side after a fall at work.  He states that he has a rib that is very tender to touch.  I recommended he see his PCP or go to the ER if he felt it was necessary for treatment.  He agreed.

## 2019-11-11 ENCOUNTER — Inpatient Hospital Stay (HOSPITAL_COMMUNITY)
Admission: EM | Admit: 2019-11-11 | Discharge: 2019-11-28 | DRG: 207 | Disposition: E | Payer: Medicare Other | Attending: Internal Medicine | Admitting: Internal Medicine

## 2019-11-11 ENCOUNTER — Emergency Department (HOSPITAL_COMMUNITY): Payer: Medicare Other

## 2019-11-11 ENCOUNTER — Other Ambulatory Visit: Payer: Self-pay

## 2019-11-11 ENCOUNTER — Encounter (HOSPITAL_COMMUNITY): Payer: Self-pay | Admitting: Internal Medicine

## 2019-11-11 DIAGNOSIS — J1282 Pneumonia due to coronavirus disease 2019: Secondary | ICD-10-CM | POA: Diagnosis present

## 2019-11-11 DIAGNOSIS — E878 Other disorders of electrolyte and fluid balance, not elsewhere classified: Secondary | ICD-10-CM | POA: Diagnosis not present

## 2019-11-11 DIAGNOSIS — I2109 ST elevation (STEMI) myocardial infarction involving other coronary artery of anterior wall: Secondary | ICD-10-CM | POA: Diagnosis not present

## 2019-11-11 DIAGNOSIS — I2541 Coronary artery aneurysm: Secondary | ICD-10-CM | POA: Diagnosis present

## 2019-11-11 DIAGNOSIS — J13 Pneumonia due to Streptococcus pneumoniae: Secondary | ICD-10-CM | POA: Diagnosis present

## 2019-11-11 DIAGNOSIS — D6859 Other primary thrombophilia: Secondary | ICD-10-CM | POA: Diagnosis present

## 2019-11-11 DIAGNOSIS — R6520 Severe sepsis without septic shock: Secondary | ICD-10-CM | POA: Diagnosis not present

## 2019-11-11 DIAGNOSIS — F419 Anxiety disorder, unspecified: Secondary | ICD-10-CM | POA: Diagnosis present

## 2019-11-11 DIAGNOSIS — E870 Hyperosmolality and hypernatremia: Secondary | ICD-10-CM | POA: Diagnosis not present

## 2019-11-11 DIAGNOSIS — F17210 Nicotine dependence, cigarettes, uncomplicated: Secondary | ICD-10-CM | POA: Diagnosis present

## 2019-11-11 DIAGNOSIS — R570 Cardiogenic shock: Secondary | ICD-10-CM | POA: Diagnosis not present

## 2019-11-11 DIAGNOSIS — Z66 Do not resuscitate: Secondary | ICD-10-CM | POA: Diagnosis not present

## 2019-11-11 DIAGNOSIS — J9600 Acute respiratory failure, unspecified whether with hypoxia or hypercapnia: Secondary | ICD-10-CM

## 2019-11-11 DIAGNOSIS — I82412 Acute embolism and thrombosis of left femoral vein: Secondary | ICD-10-CM | POA: Diagnosis present

## 2019-11-11 DIAGNOSIS — J80 Acute respiratory distress syndrome: Secondary | ICD-10-CM | POA: Diagnosis present

## 2019-11-11 DIAGNOSIS — N179 Acute kidney failure, unspecified: Secondary | ICD-10-CM | POA: Diagnosis present

## 2019-11-11 DIAGNOSIS — J9601 Acute respiratory failure with hypoxia: Secondary | ICD-10-CM | POA: Diagnosis present

## 2019-11-11 DIAGNOSIS — R739 Hyperglycemia, unspecified: Secondary | ICD-10-CM | POA: Diagnosis not present

## 2019-11-11 DIAGNOSIS — J439 Emphysema, unspecified: Secondary | ICD-10-CM | POA: Diagnosis present

## 2019-11-11 DIAGNOSIS — Z515 Encounter for palliative care: Secondary | ICD-10-CM | POA: Diagnosis not present

## 2019-11-11 DIAGNOSIS — T80219A Unspecified infection due to central venous catheter, initial encounter: Secondary | ICD-10-CM

## 2019-11-11 DIAGNOSIS — J15211 Pneumonia due to Methicillin susceptible Staphylococcus aureus: Secondary | ICD-10-CM | POA: Diagnosis present

## 2019-11-11 DIAGNOSIS — Z6830 Body mass index (BMI) 30.0-30.9, adult: Secondary | ICD-10-CM

## 2019-11-11 DIAGNOSIS — Z452 Encounter for adjustment and management of vascular access device: Secondary | ICD-10-CM

## 2019-11-11 DIAGNOSIS — D6489 Other specified anemias: Secondary | ICD-10-CM | POA: Diagnosis not present

## 2019-11-11 DIAGNOSIS — R7989 Other specified abnormal findings of blood chemistry: Secondary | ICD-10-CM | POA: Diagnosis not present

## 2019-11-11 DIAGNOSIS — U071 COVID-19: Principal | ICD-10-CM | POA: Diagnosis present

## 2019-11-11 DIAGNOSIS — A403 Sepsis due to Streptococcus pneumoniae: Secondary | ICD-10-CM | POA: Diagnosis not present

## 2019-11-11 DIAGNOSIS — E43 Unspecified severe protein-calorie malnutrition: Secondary | ICD-10-CM | POA: Diagnosis present

## 2019-11-11 DIAGNOSIS — Z820 Family history of epilepsy and other diseases of the nervous system: Secondary | ICD-10-CM

## 2019-11-11 DIAGNOSIS — E872 Acidosis: Secondary | ICD-10-CM | POA: Diagnosis present

## 2019-11-11 DIAGNOSIS — Z4659 Encounter for fitting and adjustment of other gastrointestinal appliance and device: Secondary | ICD-10-CM

## 2019-11-11 DIAGNOSIS — J939 Pneumothorax, unspecified: Secondary | ICD-10-CM

## 2019-11-11 DIAGNOSIS — I2510 Atherosclerotic heart disease of native coronary artery without angina pectoris: Secondary | ICD-10-CM | POA: Diagnosis not present

## 2019-11-11 DIAGNOSIS — F10239 Alcohol dependence with withdrawal, unspecified: Secondary | ICD-10-CM | POA: Diagnosis present

## 2019-11-11 DIAGNOSIS — R0600 Dyspnea, unspecified: Secondary | ICD-10-CM

## 2019-11-11 DIAGNOSIS — I82462 Acute embolism and thrombosis of left calf muscular vein: Secondary | ICD-10-CM | POA: Diagnosis present

## 2019-11-11 DIAGNOSIS — N170 Acute kidney failure with tubular necrosis: Secondary | ICD-10-CM | POA: Diagnosis present

## 2019-11-11 DIAGNOSIS — R6521 Severe sepsis with septic shock: Secondary | ICD-10-CM | POA: Diagnosis not present

## 2019-11-11 DIAGNOSIS — I5021 Acute systolic (congestive) heart failure: Secondary | ICD-10-CM | POA: Diagnosis present

## 2019-11-11 DIAGNOSIS — Z978 Presence of other specified devices: Secondary | ICD-10-CM

## 2019-11-11 DIAGNOSIS — I2584 Coronary atherosclerosis due to calcified coronary lesion: Secondary | ICD-10-CM | POA: Diagnosis present

## 2019-11-11 DIAGNOSIS — E875 Hyperkalemia: Secondary | ICD-10-CM | POA: Diagnosis not present

## 2019-11-11 LAB — COMPREHENSIVE METABOLIC PANEL
ALT: 32 U/L (ref 0–44)
AST: 76 U/L — ABNORMAL HIGH (ref 15–41)
Albumin: 2.2 g/dL — ABNORMAL LOW (ref 3.5–5.0)
Alkaline Phosphatase: 70 U/L (ref 38–126)
Anion gap: 11 (ref 5–15)
BUN: 24 mg/dL — ABNORMAL HIGH (ref 8–23)
CO2: 26 mmol/L (ref 22–32)
Calcium: 8 mg/dL — ABNORMAL LOW (ref 8.9–10.3)
Chloride: 93 mmol/L — ABNORMAL LOW (ref 98–111)
Creatinine, Ser: 0.89 mg/dL (ref 0.61–1.24)
GFR, Estimated: >60 mL/min (ref >60)
Glucose, Bld: 111 mg/dL — ABNORMAL HIGH (ref 70–99)
Potassium: 3.7 mmol/L (ref 3.5–5.1)
Sodium: 130 mmol/L — ABNORMAL LOW (ref 135–145)
Total Bilirubin: 0.7 mg/dL (ref 0.3–1.2)
Total Protein: 5.5 g/dL — ABNORMAL LOW (ref 6.5–8.1)

## 2019-11-11 LAB — CBC WITH DIFFERENTIAL/PLATELET
Abs Immature Granulocytes: 0 10*3/uL (ref 0.00–0.07)
Basophils Absolute: 0 10*3/uL (ref 0.0–0.1)
Basophils Relative: 0 %
Eosinophils Absolute: 0 10*3/uL (ref 0.0–0.5)
Eosinophils Relative: 0 %
HCT: 40.5 % (ref 39.0–52.0)
Hemoglobin: 13.9 g/dL (ref 13.0–17.0)
Lymphocytes Relative: 0 %
Lymphs Abs: 0 10*3/uL — ABNORMAL LOW (ref 0.7–4.0)
MCH: 32 pg (ref 26.0–34.0)
MCHC: 34.3 g/dL (ref 30.0–36.0)
MCV: 93.1 fL (ref 80.0–100.0)
Monocytes Absolute: 0.1 10*3/uL (ref 0.1–1.0)
Monocytes Relative: 1 %
Neutro Abs: 12.5 10*3/uL — ABNORMAL HIGH (ref 1.7–7.7)
Neutrophils Relative %: 99 %
Platelets: 224 10*3/uL (ref 150–400)
RBC: 4.35 MIL/uL (ref 4.22–5.81)
RDW: 13.6 % (ref 11.5–15.5)
WBC: 12.6 10*3/uL — ABNORMAL HIGH (ref 4.0–10.5)
nRBC: 0 % (ref 0.0–0.2)
nRBC: 0 /100 WBC

## 2019-11-11 LAB — C-REACTIVE PROTEIN: CRP: 22.3 mg/dL — ABNORMAL HIGH (ref <1.0)

## 2019-11-11 LAB — TROPONIN I (HIGH SENSITIVITY)
Troponin I (High Sensitivity): 33 ng/L — ABNORMAL HIGH (ref <18)
Troponin I (High Sensitivity): 24 ng/L — ABNORMAL HIGH (ref <18)

## 2019-11-11 LAB — BRAIN NATRIURETIC PEPTIDE: B Natriuretic Peptide: 84.8 pg/mL (ref 0.0–100.0)

## 2019-11-11 LAB — CBC
HCT: 40.6 % (ref 39.0–52.0)
Hemoglobin: 13.8 g/dL (ref 13.0–17.0)
MCH: 31.5 pg (ref 26.0–34.0)
MCHC: 34 g/dL (ref 30.0–36.0)
MCV: 92.7 fL (ref 80.0–100.0)
Platelets: 214 10*3/uL (ref 150–400)
RBC: 4.38 MIL/uL (ref 4.22–5.81)
RDW: 13.7 % (ref 11.5–15.5)
WBC: 10.3 10*3/uL (ref 4.0–10.5)
nRBC: 0 % (ref 0.0–0.2)

## 2019-11-11 LAB — HIV ANTIBODY (ROUTINE TESTING W REFLEX): HIV Screen 4th Generation wRfx: NONREACTIVE

## 2019-11-11 LAB — LACTIC ACID, PLASMA
Lactic Acid, Venous: 2.4 mmol/L (ref 0.5–1.9)
Lactic Acid, Venous: 2.8 mmol/L (ref 0.5–1.9)
Lactic Acid, Venous: 3.4 mmol/L (ref 0.5–1.9)

## 2019-11-11 LAB — TRIGLYCERIDES: Triglycerides: 185 mg/dL — ABNORMAL HIGH (ref <150)

## 2019-11-11 LAB — LACTATE DEHYDROGENASE: LDH: 671 U/L — ABNORMAL HIGH (ref 98–192)

## 2019-11-11 LAB — CREATININE, SERUM
Creatinine, Ser: 0.76 mg/dL (ref 0.61–1.24)
GFR, Estimated: >60 mL/min (ref >60)

## 2019-11-11 LAB — PROCALCITONIN: Procalcitonin: 0.56 ng/mL

## 2019-11-11 LAB — RESPIRATORY PANEL BY RT PCR (FLU A&B, COVID)
Influenza A by PCR: NEGATIVE
Influenza B by PCR: NEGATIVE
SARS Coronavirus 2 by RT PCR: POSITIVE — AB

## 2019-11-11 LAB — FERRITIN: Ferritin: 2896 ng/mL — ABNORMAL HIGH (ref 24–336)

## 2019-11-11 LAB — FIBRINOGEN: Fibrinogen: 556 mg/dL — ABNORMAL HIGH (ref 210–475)

## 2019-11-11 LAB — D-DIMER, QUANTITATIVE: D-Dimer, Quant: >20 ug/mL-FEU — ABNORMAL HIGH (ref 0.00–0.50)

## 2019-11-11 MED ORDER — SODIUM CHLORIDE 0.9 % IV SOLN
100.0000 mg | Freq: Every day | INTRAVENOUS | Status: AC
  Administered 2019-11-12 – 2019-11-15 (×4): 100 mg via INTRAVENOUS
  Filled 2019-11-11 (×5): qty 20

## 2019-11-11 MED ORDER — LACTATED RINGERS IV BOLUS
1000.0000 mL | Freq: Once | INTRAVENOUS | Status: AC
  Administered 2019-11-11: 1000 mL via INTRAVENOUS

## 2019-11-11 MED ORDER — DEXAMETHASONE 6 MG PO TABS
6.0000 mg | ORAL_TABLET | ORAL | Status: DC
  Administered 2019-11-11 – 2019-11-12 (×2): 6 mg via ORAL
  Filled 2019-11-11 (×2): qty 1

## 2019-11-11 MED ORDER — SODIUM CHLORIDE 0.9 % IV SOLN
200.0000 mg | Freq: Once | INTRAVENOUS | Status: AC
  Administered 2019-11-11: 200 mg via INTRAVENOUS
  Filled 2019-11-11: qty 40

## 2019-11-11 MED ORDER — SODIUM CHLORIDE 0.9 % IV BOLUS: 500.0000 mL | Freq: Once | INTRAVENOUS | Status: DC

## 2019-11-11 MED ORDER — DEXAMETHASONE SODIUM PHOSPHATE 10 MG/ML IJ SOLN
10.0000 mg | Freq: Once | INTRAMUSCULAR | Status: AC
  Administered 2019-11-11: 10 mg via INTRAVENOUS
  Filled 2019-11-11: qty 1

## 2019-11-11 MED ORDER — ONDANSETRON HCL 4 MG PO TABS: 4.0000 mg | ORAL_TABLET | Freq: Four times a day (QID) | ORAL | Status: DC | PRN

## 2019-11-11 MED ORDER — ACETAMINOPHEN 325 MG PO TABS
650.0000 mg | ORAL_TABLET | Freq: Four times a day (QID) | ORAL | Status: DC | PRN
  Administered 2019-11-11 – 2019-11-12 (×3): 650 mg via ORAL
  Filled 2019-11-11 (×3): qty 2

## 2019-11-11 MED ORDER — POLYETHYLENE GLYCOL 3350 17 G PO PACK
17.0000 g | PACK | Freq: Every day | ORAL | Status: DC | PRN
  Administered 2019-11-16: 17 g via ORAL
  Filled 2019-11-11: qty 1

## 2019-11-11 MED ORDER — ENOXAPARIN SODIUM 40 MG/0.4ML ~~LOC~~ SOLN
40.0000 mg | SUBCUTANEOUS | Status: DC
  Administered 2019-11-11 – 2019-11-12 (×2): 40 mg via SUBCUTANEOUS
  Filled 2019-11-11 (×2): qty 0.4

## 2019-11-11 MED ORDER — LACTATED RINGERS IV SOLN: INTRAVENOUS | Status: AC

## 2019-11-11 MED ORDER — IPRATROPIUM-ALBUTEROL 20-100 MCG/ACT IN AERS
1.0000 | INHALATION_SPRAY | Freq: Four times a day (QID) | RESPIRATORY_TRACT | Status: DC
  Administered 2019-11-11 – 2019-11-13 (×7): 1 via RESPIRATORY_TRACT
  Filled 2019-11-11 (×2): qty 4

## 2019-11-11 MED ORDER — ONDANSETRON HCL 4 MG/2ML IJ SOLN: 4.0000 mg | Freq: Four times a day (QID) | INTRAMUSCULAR | Status: DC | PRN

## 2019-11-11 MED ORDER — GUAIFENESIN-DM 100-10 MG/5ML PO SYRP
10.0000 mL | ORAL_SOLUTION | ORAL | Status: DC | PRN
  Administered 2019-11-11 – 2019-11-12 (×2): 10 mL via ORAL
  Filled 2019-11-11 (×2): qty 10

## 2019-11-11 MED ORDER — BARICITINIB 2 MG PO TABS
4.0000 mg | ORAL_TABLET | Freq: Every day | ORAL | Status: DC
  Administered 2019-11-11 – 2019-11-14 (×4): 4 mg via ORAL
  Filled 2019-11-11 (×4): qty 2

## 2019-11-11 MED ORDER — ENSURE ENLIVE PO LIQD
237.0000 mL | Freq: Two times a day (BID) | ORAL | Status: DC
  Administered 2019-11-12: 237 mL via ORAL

## 2019-11-11 MED ORDER — SODIUM CHLORIDE 0.9 % IV BOLUS
1000.0000 mL | Freq: Once | INTRAVENOUS | Status: AC
  Administered 2019-11-11: 1000 mL via INTRAVENOUS

## 2019-11-11 NOTE — TOC Initial Note (Signed)
Transition of Care [facility]) - Initial/Assessment Note    Patient Details  Name: Charles Dawson MRN: [id number] Date of Birth: 02-26-1955  [facility]) CM/SW Contact:    Lockie Pares, RN Phone Number: 11/02/2019, 4:55 PM  Clinical Narrative:                 History reviewed, Patient admitted with COVID pneumonia, lives alone, NOK brother. Nonvaccinated. Presents with worsening SHOB, symptoms since 10/7. On 15L NRB currently. Will most likely need oxygen at home. CM will follow for needs.  Expected Discharge Plan: Home/Self Care Barriers to Discharge: Continued Medical Work up   Patient Goals and CMS Choice        Expected Discharge Plan and Services Expected Discharge Plan: Home/Self Care       Living arrangements for the past 2 months: Single Family Home                                      Prior Living Arrangements/Services Living arrangements for the past 2 months: Single Family Home Lives with:: Self Patient language and need for interpreter reviewed:: Yes Do you feel safe going back to the place where you live?: Yes      Need for Family Participation in Patient Care: Yes (Comment) Care giver support system in place?: Yes (comment)   Criminal Activity/Legal Involvement Pertinent to Current Situation/Hospitalization: No - Comment as needed  Activities of Daily Living Home Assistive Devices/Equipment: None ADL Screening (condition at time of admission) Patient's cognitive ability adequate to safely complete daily activities?: No Is the patient deaf or have difficulty hearing?: No Does the patient have difficulty seeing, even when wearing glasses/contacts?: No Does the patient have difficulty concentrating, remembering, or making decisions?: No Patient able to express need for assistance with ADLs?: Yes Does the patient have difficulty dressing or bathing?: No Independently performs ADLs?: Yes (appropriate for developmental age) Does the  patient have difficulty walking or climbing stairs?: No Weakness of Legs: None Weakness of Arms/Hands: None  Permission Sought/Granted                  Emotional Assessment       Orientation: : Oriented to Self, Oriented to Place, Oriented to  Time, Oriented to Situation Alcohol / Substance Use: Not Applicable Psych Involvement: No (comment)  Admission diagnosis:  Acute respiratory failure with hypoxia (HCC) [J96.01] COVID-19 [U07.1] Patient Active Problem List   Diagnosis Date Noted  . COVID-19 11/26/2019  . Empyema (HCC) 12/27/2011  . Centrilobular emphysema, mild 12/25/2011  . CAP (community acquired pneumonia) 12/25/2011   PCP:  Default, Provider, MD Pharmacy:   [facility] - [place] - [address] [place] [address] [place] [address] [place] [address] Phone: [phone] Fax: [phone]  [id number] - [place], [place] - [address] AT [facility] [address] [place] [address] Phone: [phone] Fax: [phone]     Social Determinants of Health (SDOH) Interventions    Readmission Risk Interventions No flowsheet data found.

## 2019-11-11 NOTE — Progress Notes (Signed)
Dr. Laural Benes paged regarding increase of lactic acid from 2.4 to 2.8. orders received. Will continue to monitor.

## 2019-11-11 NOTE — Progress Notes (Signed)
Charles Dawson [id number] Admission Data: 10/30/2019 4:04 PM Attending Provider: Gust Rung, DO  VZD:GLOVFIE, Provider, MD  Charles Dawson is a 64 y.o. male patient admitted from ED awake, alert  & orientated  X 3,  Full Code, VSS - Blood pressure 101/68, pulse 76, temperature 98.5 F (36.9 C), resp. rate 16, SpO2 (!) 87 %., O2 on 15L HFNC. Tele # [id number] placed and pt is currently running:normal sinus rhythm.  Pt orientation to unit, room and routine. Information packet given to patient/family.  Admission INP armband ID verified with patient/family, and in place. SR up x 2, fall risk assessment complete with patient and family verbalizing understanding of risks associated with falls. Pt verbalizes an understanding of how to use the call bell and to call for help before getting out of bed.  Skin, clean-dry- intact without evidence of bruising, or skin tears.    Will continue to monitor and assist as needed.  Lyndal Pulley, RN 10/29/2019 4:04 PM

## 2019-11-11 NOTE — ED Provider Notes (Signed)
[facility]-EMERGENCY DEPT [facility] Emergency Department Provider Note MRN:  [id number]  Arrival date & time: 11/18/2019     Chief Complaint   Respiratory distress History of Present Illness   Charles Dawson is a 64 y.o. year-old male with a history of emphysema presenting to the ED with chief complaint of shortness of breath.  Shortness of breath for the past 5 or 6 days, no chest pain, mild cough, profoundly hypoxic upon EMS arrival, in respiratory distress.  Not vaccinated for Covid.  Denies chest pain.  Review of Systems  Positive for shortness of breath cough.  Patient's Health History    Past Medical History:  Diagnosis Date  . Anxiety   . Empyema 12/27/2011    Past Surgical History:  Procedure Laterality Date  . DENTAL SURGERY    . VIDEO ASSISTED THORACOSCOPY (VATS)/EMPYEMA  12/27/2011   Procedure: VIDEO ASSISTED THORACOSCOPY (VATS)/EMPYEMA;  Surgeon: Purcell Nails, MD;  Location: [facility] OR;  Service: Thoracic;  Laterality: Left;  Marland Kitchen VIDEO BRONCHOSCOPY  12/27/2011   Procedure: VIDEO BRONCHOSCOPY;  Surgeon: Purcell Nails, MD;  Location: [facility] OR;  Service: Thoracic;  Laterality: N/A;    No family history on file.  Social History   Socioeconomic History  . Marital status: Single    Spouse name: Not on file  . Number of children: Not on file  . Years of education: Not on file  . Highest education level: Not on file  Occupational History  . Not on file  Tobacco Use  . Smoking status: Former Smoker  Substance and Sexual Activity  . Alcohol use: Yes    Alcohol/week: 84.0 standard drinks    Types: 84 Cans of beer per week  . Drug use: Yes    Comment: takes someone else's prescription hydrocodone  . Sexual activity: Not on file  Other Topics Concern  . Not on file  Social History Narrative  . Not on file   Social Determinants of Health   Financial Resource Strain:   . Difficulty of Paying Living Expenses: Not on file  Food Insecurity:   . Worried About Community education officer in the Last Year: Not on file  . Ran Out of Food in the Last Year: Not on file  Transportation Needs:   . Lack of Transportation (Medical): Not on file  . Lack of Transportation (Non-Medical): Not on file  Physical Activity:   . Days of Exercise per Week: Not on file  . Minutes of Exercise per Session: Not on file  Stress:   . Feeling of Stress : Not on file  Social Connections:   . Frequency of Communication with Friends and Family: Not on file  . Frequency of Social Gatherings with Friends and Family: Not on file  . Attends Religious Services: Not on file  . Active Member of Clubs or Organizations: Not on file  . Attends Banker Meetings: Not on file  . Marital Status: Not on file  Intimate Partner Violence:   . Fear of Current or Ex-Partner: Not on file  . Emotionally Abused: Not on file  . Physically Abused: Not on file  . Sexually Abused: Not on file     Physical Exam   Vitals:   11/24/2019 1031  BP: 110/72  Pulse: 89  Resp: (!) 43  Temp: 99.1 F (37.3 C)  SpO2: 96%    CONSTITUTIONAL: Chronically ill-appearing, in moderate respiratory distress NEURO:  Alert and oriented x 3, no focal deficits EYES:  eyes equal and reactive ENT/NECK:  no LAD, no JVD CARDIO: Regular rate, well-perfused, normal S1 and S2 PULM: Scattered rhonchi, mild accessory muscle use, tachypneic GI/GU:  normal bowel sounds, non-distended, non-tender MSK/SPINE:  No gross deformities, no edema SKIN:  no rash, atraumatic PSYCH:  Appropriate speech and behavior  *Additional and/or pertinent findings included in MDM below  Diagnostic and Interventional Summary    EKG Interpretation  Date/Time:  Friday November 11 2019 10:23:23 EDT Ventricular Rate:  94 PR Interval:    QRS Duration: 111 QT Interval:  351 QTC Calculation: 439 R Axis:   -128 Text Interpretation: Sinus or ectopic atrial rhythm Short PR interval Probable lateral infarct, age indeterminate Borderline ST  elevation, anterior leads Confirmed by Kennis Carina [phone]) on 11/24/2019 10:26:34 AM      Labs Reviewed  RESPIRATORY PANEL BY RT PCR (FLU A&B, COVID)  CULTURE, BLOOD (ROUTINE X 2)  CULTURE, BLOOD (ROUTINE X 2)  LACTIC ACID, PLASMA  LACTIC ACID, PLASMA  CBC WITH DIFFERENTIAL/PLATELET  COMPREHENSIVE METABOLIC PANEL  D-DIMER, QUANTITATIVE (NOT AT [facility])  PROCALCITONIN  LACTATE DEHYDROGENASE  FERRITIN  TRIGLYCERIDES  FIBRINOGEN  C-REACTIVE PROTEIN  BRAIN NATRIURETIC PEPTIDE  TROPONIN I (HIGH SENSITIVITY)    DG Chest Port 1 View    (Results Pending)    Medications  dexamethasone (DECADRON) injection 10 mg (10 mg Intravenous Given 11/23/2019 1038)     Procedures  /  Critical Care .Critical Care Performed by: Sabas Sous, MD Authorized by: Sabas Sous, MD   Critical care provider statement:    Critical care time (minutes):  45   Critical care was necessary to treat or prevent imminent or life-threatening deterioration of the following conditions:  Respiratory failure   Critical care was time spent personally by me on the following activities:  Discussions with consultants, evaluation of patient's response to treatment, examination of patient, ordering and performing treatments and interventions, ordering and review of laboratory studies, ordering and review of radiographic studies, pulse oximetry, re-evaluation of patient's condition, obtaining history from patient or surrogate and review of old charts    ED Course and Medical Decision Making  I have reviewed the triage vital signs, the nursing notes, and pertinent available records from the EMR.  Listed above are laboratory and imaging tests that I personally ordered, reviewed, and interpreted and then considered in my medical decision making (see below for details).  Concern for COVID-19 causing this profound hypoxic respiratory failure, also considering COPD exacerbation, less likely cardiac etiology.  EKG is overall  reassuring, providing empiric Decadron, awaiting Covid test result, chest x-ray, labs.  PE also a consideration, will consider CTA after chest x-ray and labs.     Patient has tested positive for Covid, chest x-ray with diffuse infiltrates, providing Decadron, admitting to internal medicine.  Elmer Sow. Pilar Plate, MD [facility] Emergency Medicine [facility] [email or web address]  Final Clinical Impressions(s) / ED Diagnoses     ICD-10-CM   1. Acute respiratory failure with hypoxia (HCC)  J96.01     ED Discharge Orders    None       Discharge Instructions Discussed with and Provided to Patient:   Discharge Instructions   None       Sabas Sous, MD 11/03/2019 1318

## 2019-11-11 NOTE — Progress Notes (Signed)
CRITICAL VALUE ALERT  Critical Value:  Lactic acid 3.4  Date & Time Notied:  20-Nov-2019  2245  Provider Notified: Imogene Burn, MD  Orders Received/Actions taken: LR bolus started.

## 2019-11-11 NOTE — ED Triage Notes (Signed)
PT was brought by EMS. Fire reported pts HR was in the 150's and O2 was 32% room air on arrival to home. Pt states he has been SOB for 5-6 days. No chest pain. Pt received 2 albuterol tx per EMS. Pt was 69% on RA on arrival to ED with labored breathing. NRB applied at 15L, SpO2 currently 91%. Current smoker, no known hx of resp disease. A&O x4, tachypneic, all other v/s stable. MD to bedside.  EMS vitals  BP- 104/60 P-96 R-42 O2- 87 BS-120 Ax temp- 101.7

## 2019-11-11 NOTE — H&P (Addendum)
Date: Nov 27, 2019               Patient Name:  Charles Dawson MRN: [id number]  DOB: 08/04/1955 Age / Sex: 64 y.o., male   PCP: Default, Provider, MD         Medical Service: Internal Medicine Teaching Service         Attending Physician: Dr. Sandre Kitty Elwin Mocha, MD    First Contact: Dr. Marlyce Huge Pager: [phone]  Second Contact: Dr. Barbaraann Faster Pager: [phone]       After Hours (After 5p/  First Contact Pager: [phone]  weekends / holidays): Second Contact Pager: [phone]   Chief Complaint: Shortness of breath  History of Present Illness: Charles Dawson is a 63 year old male who is not followed for any chronic medical issues  Presenting today for shortness of breath he has been experiencing since Thursday (10/7).  He reports over the weekend he had an episode where he became acutely more short of breath.  This week he has progressively felt worse and become more short of breath so decided to call EMS.  Denies any headache, lightheadedness, chest pain, sore throat,decreased appetite, loss of taste, changes in bowel or bladder habits, dysuria, difficulty walking,or decreased sensation.   Meds: Not currently taking any daily medications   Allergies: Allergies as of 2019-11-27   (No Known Allergies)   Past Medical History:  Diagnosis Date   Anxiety    Empyema (HCC) 12/27/2011    Family History: Patient reports both parents lived into their 7s.  His mother had Alzheimer's  Social History: Patient smoked for 40 years and quit.  He has recently started smoking again occasionally.  Drinks a few beers each day. Denies any substance use. Reports he is retired and lives at home alone. Gives a contact incase of emergency, [name] [phone]  Review of Systems: A complete ROS was negative except as per HPI.   Physical Exam: Blood pressure 108/79, pulse 65, temperature 97.8 F (36.6 C), temperature source Oral, resp. rate (!) 25, height 5\' 8"  (1.727 m), weight 73.2 kg, SpO2 94  %.   General: Caucasian male normal weight wearing nonrebreather, appears sick HE: Normocephalic, atraumatic , EOMI, Conjunctivae normal ENT: No congestion, no rhinorrhea, no exudate or erythema  Cardiovascular: Normal rate, regular rhythm.  No murmurs, rubs, or gallops Pulmonary : tachypnic, accessory muscles,  Rhonchi, no wheezing Abdominal: soft, nontender,  bowel sounds present Musculoskeletal: no swelling , deformity, injury ,or tenderness in extremities, Skin: Warm, dry , no bruising, erythema, or rash Psychiatric/Behavioral:  normal mood, normal behavior  Neuro: Alert and oriented x4, no focal deficits   EKG: personally reviewed my interpretation is sinus rhythm, prolonged QTC, prominent S wave in leads I, II and III, no significant ST changes  CXR: personally reviewed my interpretation is bilateral patchy airspace, no focal consolidations, no pleural effusions, no bony abnormalities  Assessment & Plan by Problem: Active Problems:   COVID-19  Charles Dawson is a 64 year old with history of tobacco use disorder presents with shortness of breath found to have COVID-19 pneumonia.  #COVID-19 pneumonia Patient symptoms started on Thursday, October 7.  Patient is presenting today requiring 15 L nonrebreather and now sating 96%.  He was not vaccinated.  He lives at home alone and retired and says he thought he was not at high risk.  He is unsure who his sick contact was.  CRP elevated to 22.3 patient requiring 15 L nonrebreather, given oxygen requirement and elevated CRP we will  start baricitinib.  Pro-Cal is elevated, however will hold off on antibiotics at this time given patient is having a non productive cough, afebrile, and no consolidation on chestray to suggest bacterial pneumonia. Blood cultures obtained.  -Decadron day 1 of 10 -Remdesivir day 1 of 5 -Baricitinib day 1 of 14 -Supplemental oxygen to maintain sats greater than 88% -As needed Robitussin for cough -Ipratropium and  albuterol nebulizer every 6 hours -Trend inflammatory markers -Daily CBC and CMP  #Lactic acidosis Lactic acid 2.4 initially, will trend with fluid resuscitation.  Patient was given 1 L normal saline in ED -LR 100 mL/h x 10 hours  #Elevated D-dimer greater than 20, concern for PE Patient has COVID-19 pneumonia and expect elevated D-dimer.  Patient D-dimer is elevated greater than 20 and given he reports a acute change in shortness of breath this weekend we will further evaluate with CT PE - CT PE  #History of alcohol use disorder -We will put on CIWA protocol   Dispo: Admit patient to Inpatient with expected length of stay greater than 2 midnights.  Signed: Albertha Ghee, MD 11/17/2019, 9:45 PM  Pager: [phone] After 5pm on weekdays and 1pm on weekends: On Call pager: [phone]

## 2019-11-12 ENCOUNTER — Inpatient Hospital Stay (HOSPITAL_COMMUNITY): Payer: Medicare Other

## 2019-11-12 DIAGNOSIS — U071 COVID-19: Secondary | ICD-10-CM | POA: Diagnosis not present

## 2019-11-12 DIAGNOSIS — J1282 Pneumonia due to coronavirus disease 2019: Secondary | ICD-10-CM | POA: Diagnosis not present

## 2019-11-12 LAB — CBC WITH DIFFERENTIAL/PLATELET
Abs Immature Granulocytes: 0.06 10*3/uL (ref 0.00–0.07)
Basophils Absolute: 0 10*3/uL (ref 0.0–0.1)
Basophils Relative: 0 %
Eosinophils Absolute: 0 10*3/uL (ref 0.0–0.5)
Eosinophils Relative: 0 %
HCT: 34.6 % — ABNORMAL LOW (ref 39.0–52.0)
Hemoglobin: 11.9 g/dL — ABNORMAL LOW (ref 13.0–17.0)
Immature Granulocytes: 1 %
Lymphocytes Relative: 18 %
Lymphs Abs: 1.3 10*3/uL (ref 0.7–4.0)
MCH: 32.2 pg (ref 26.0–34.0)
MCHC: 34.4 g/dL (ref 30.0–36.0)
MCV: 93.5 fL (ref 80.0–100.0)
Monocytes Absolute: 0.1 10*3/uL (ref 0.1–1.0)
Monocytes Relative: 1 %
Neutro Abs: 5.5 10*3/uL (ref 1.7–7.7)
Neutrophils Relative %: 80 %
Platelets: 227 10*3/uL (ref 150–400)
RBC: 3.7 MIL/uL — ABNORMAL LOW (ref 4.22–5.81)
RDW: 13.9 % (ref 11.5–15.5)
WBC: 7.2 10*3/uL (ref 4.0–10.5)
nRBC: 0 % (ref 0.0–0.2)

## 2019-11-12 LAB — BLOOD CULTURE ID PANEL (REFLEXED) - BCID2

## 2019-11-12 LAB — LACTIC ACID, PLASMA
Lactic Acid, Venous: 2.4 mmol/L (ref 0.5–1.9)
Lactic Acid, Venous: 2.2 mmol/L (ref 0.5–1.9)

## 2019-11-12 LAB — D-DIMER, QUANTITATIVE: D-Dimer, Quant: >20 ug/mL-FEU — ABNORMAL HIGH (ref 0.00–0.50)

## 2019-11-12 LAB — COMPREHENSIVE METABOLIC PANEL
ALT: 27 U/L (ref 0–44)
AST: 61 U/L — ABNORMAL HIGH (ref 15–41)
Albumin: 2 g/dL — ABNORMAL LOW (ref 3.5–5.0)
Alkaline Phosphatase: 57 U/L (ref 38–126)
Anion gap: 10 (ref 5–15)
BUN: 20 mg/dL (ref 8–23)
CO2: 24 mmol/L (ref 22–32)
Calcium: 7.8 mg/dL — ABNORMAL LOW (ref 8.9–10.3)
Chloride: 99 mmol/L (ref 98–111)
Creatinine, Ser: 0.73 mg/dL (ref 0.61–1.24)
GFR, Estimated: >60 mL/min (ref >60)
Glucose, Bld: 131 mg/dL — ABNORMAL HIGH (ref 70–99)
Potassium: 3.8 mmol/L (ref 3.5–5.1)
Sodium: 133 mmol/L — ABNORMAL LOW (ref 135–145)
Total Bilirubin: 0.6 mg/dL (ref 0.3–1.2)
Total Protein: 5.2 g/dL — ABNORMAL LOW (ref 6.5–8.1)

## 2019-11-12 LAB — MAGNESIUM: Magnesium: 2.1 mg/dL (ref 1.7–2.4)

## 2019-11-12 LAB — C-REACTIVE PROTEIN: CRP: 19.9 mg/dL — ABNORMAL HIGH (ref <1.0)

## 2019-11-12 LAB — FERRITIN: Ferritin: 2846 ng/mL — ABNORMAL HIGH (ref 24–336)

## 2019-11-12 LAB — PHOSPHORUS: Phosphorus: 3.1 mg/dL (ref 2.5–4.6)

## 2019-11-12 MED ORDER — ADULT MULTIVITAMIN W/MINERALS CH
1.0000 | ORAL_TABLET | Freq: Every day | ORAL | Status: DC
  Administered 2019-11-12: 1 via ORAL
  Filled 2019-11-12: qty 1

## 2019-11-12 MED ORDER — SALINE SPRAY 0.65 % NA SOLN
1.0000 | NASAL | Status: DC | PRN
  Filled 2019-11-12: qty 44

## 2019-11-12 MED ORDER — IOHEXOL 350 MG/ML SOLN
100.0000 mL | Freq: Once | INTRAVENOUS | Status: AC | PRN
  Administered 2019-11-12: 100 mL via INTRAVENOUS

## 2019-11-12 NOTE — Progress Notes (Addendum)
   Subjective: CTA negative for PE. Lactate 3.4 overnight, given 1L bolus, improved to 2.4>2.2.  Feeling about the same as yesterday. Satting >95% on 15L NRB. Inflammatory markers still elevated. Patient is now on day 2 of remdesivir, decadron, and baricitinib.  Continue current management for now.  Objective:  Vital signs in last 24 hours: Vitals:   11/16/2019 2101 11/21/2019 2110 11/12/19 0000 11/12/19 0400  BP:   103/70 116/78  Pulse:  65 (!) 56 61  Resp:  (!) 25 (!) 25 20  Temp:   98.3 F (36.8 C) 97.7 F (36.5 C)  TempSrc:   Oral Oral  SpO2: (!) 87% 94% (!) 89% 100%  Weight:      Height:       Physical Exam: General: caucasian male, lying in bed on nonrebreather, appearing ill but NAD. CV: slightly bradycardic, regular rhythm. No m/r/g Pulm: tachypneic, satting 100% on 15L NRB. Coarse breath sounds bilaterally. Abdomen: soft, nontender, nondistended, +BS. MSK: no edema noted Skin: warm and dry, no rashes noted Neuro: AAOx3, no focal deficits noted.  Assessment/Plan:  Active Problems:   COVID-19  COVID-19 PNA On day 2 of remdesivir, decadron, and baricitinib (which was started due to elevated inflammatory markers). Appears grossly unchanged from yesterday. Lactate trending down today. CTA negative for PE. Will continue current management for now. -decadron day 2/10, remdesivir day 2/5, baricitinib day 2/14 -supplemental oxygen to maintain sats >90% -robitussin prn for cough -duonebs q6h -trend inflammatory markers -daily cbc and cmp -blood cultures showing staph epidermidis (1/4), likely contaminant  History of Alcohol Use -CIWA protocol  Prior to Admission Living Arrangement: Home Anticipated Discharge Location: TBD Barriers to Discharge: continued medical management Dispo: TBD   Merrilyn Puma, MD 11/12/2019, 1:23 PM Pager: [phone] After 5pm on weekdays and 1pm on weekends: On Call pager [phone]

## 2019-11-12 NOTE — Progress Notes (Signed)
Initial Nutrition Assessment  RD working remotely.  DOCUMENTATION CODES:   Not applicable  INTERVENTION:   - Continue Ensure Enlive po BID, each supplement provides 350 kcal and 20 grams of protein  - MVI with minerals daily  - Encourage adequate PO intake  NUTRITION DIAGNOSIS:   Increased nutrient needs related to acute illness (COVID-19 pneumonia) as evidenced by estimated needs.  GOAL:   Patient will meet greater than or equal to 90% of their needs  MONITOR:   PO intake, Supplement acceptance, Labs, Weight trends  REASON FOR ASSESSMENT:   Malnutrition Screening Tool    ASSESSMENT:   64 year old male who presented to the ED on 10/15 with SOB. PMH of tobacco use, EtOH abuse. Pt found to have COVID-19 pneumonia.   RD was unable to reach pt via phone call to room. Meal completion at breakfast this morning was 60%. Weight history in chart is limited as last weight available PTA is from 2013. Ensure Enlive ordered BID. RD will also order daily MVI.  Meal Completion: 60%  Medications reviewed and include: decadron, Ensure Enlive BID, remdesivir  Labs reviewed: sodium 133  UOP: 2600 ml x 24 hours I/O's: +854 ml since admit  NUTRITION - FOCUSED PHYSICAL EXAM:  Unable to complete at this time. RD working remotely.  Diet Order:   Diet Order            Diet regular Room service appropriate? Yes; Fluid consistency: Thin  Diet effective now                 EDUCATION NEEDS:   No education needs have been identified at this time  Skin:  Skin Assessment: Reviewed RN Assessment  Last BM:  11/09/19  Height:   Ht Readings from Last 1 Encounters:  11/07/2019 5\' 8"  (1.727 m)    Weight:   Wt Readings from Last 1 Encounters:  11/07/2019 73.2 kg    BMI:  Body mass index is 24.53 kg/m.  Estimated Nutritional Needs:   Kcal:  2000-2200  Protein:  100-115 grams  Fluid:  >/= 2.0 L    11/07/2019, MS, RD, LDN Inpatient Clinical  Dietitian Please see AMiON for contact information.

## 2019-11-12 NOTE — Progress Notes (Signed)
Bed alarm audile, patient up to side of bed, oxygen removed by patient.  Oxygen mask replaced, patient is short of breath, using accessory muscles.  Patient positioned in bed, oxygen saturation in 90s at this time.  Advised patient to call before getting up and to leave oxygen mask on his face.  Offered to place on nasal cannula, patient refused.

## 2019-11-13 ENCOUNTER — Inpatient Hospital Stay (HOSPITAL_COMMUNITY): Payer: Medicare Other

## 2019-11-13 ENCOUNTER — Encounter (HOSPITAL_COMMUNITY): Admission: EM | Disposition: E | Payer: Self-pay | Source: Home / Self Care | Attending: Pulmonary Disease

## 2019-11-13 ENCOUNTER — Encounter (HOSPITAL_COMMUNITY): Payer: Self-pay | Admitting: Internal Medicine

## 2019-11-13 DIAGNOSIS — U071 COVID-19: Principal | ICD-10-CM

## 2019-11-13 DIAGNOSIS — R7989 Other specified abnormal findings of blood chemistry: Secondary | ICD-10-CM | POA: Diagnosis not present

## 2019-11-13 DIAGNOSIS — I2109 ST elevation (STEMI) myocardial infarction involving other coronary artery of anterior wall: Secondary | ICD-10-CM

## 2019-11-13 DIAGNOSIS — I2510 Atherosclerotic heart disease of native coronary artery without angina pectoris: Secondary | ICD-10-CM | POA: Diagnosis not present

## 2019-11-13 DIAGNOSIS — J1282 Pneumonia due to Coronavirus disease 2019: Secondary | ICD-10-CM | POA: Diagnosis not present

## 2019-11-13 HISTORY — PX: LEFT HEART CATH AND CORONARY ANGIOGRAPHY: CATH118249

## 2019-11-13 LAB — TRIGLYCERIDES: Triglycerides: 166 mg/dL — ABNORMAL HIGH (ref <150)

## 2019-11-13 LAB — PHOSPHORUS: Phosphorus: 3.9 mg/dL (ref 2.5–4.6)

## 2019-11-13 LAB — FERRITIN: Ferritin: 3866 ng/mL — ABNORMAL HIGH (ref 24–336)

## 2019-11-13 LAB — C-REACTIVE PROTEIN
CRP: 11.9 mg/dL — ABNORMAL HIGH (ref <1.0)
CRP: 11 mg/dL — ABNORMAL HIGH (ref <1.0)

## 2019-11-13 LAB — CBC WITH DIFFERENTIAL/PLATELET
Abs Immature Granulocytes: 0.13 10*3/uL — ABNORMAL HIGH (ref 0.00–0.07)
Basophils Absolute: 0 10*3/uL (ref 0.0–0.1)
Basophils Relative: 0 %
Eosinophils Absolute: 0 10*3/uL (ref 0.0–0.5)
Eosinophils Relative: 0 %
HCT: 45.5 % (ref 39.0–52.0)
Hemoglobin: 15.2 g/dL (ref 13.0–17.0)
Immature Granulocytes: 1 %
Lymphocytes Relative: 11 %
Lymphs Abs: 2.1 10*3/uL (ref 0.7–4.0)
MCH: 31.5 pg (ref 26.0–34.0)
MCHC: 33.4 g/dL (ref 30.0–36.0)
MCV: 94.2 fL (ref 80.0–100.0)
Monocytes Absolute: 0.5 10*3/uL (ref 0.1–1.0)
Monocytes Relative: 3 %
Neutro Abs: 16.4 10*3/uL — ABNORMAL HIGH (ref 1.7–7.7)
Neutrophils Relative %: 85 %
Platelets: 204 10*3/uL (ref 150–400)
RBC: 4.83 MIL/uL (ref 4.22–5.81)
RDW: 14 % (ref 11.5–15.5)
WBC: 19.2 10*3/uL — ABNORMAL HIGH (ref 4.0–10.5)
nRBC: 0 % (ref 0.0–0.2)

## 2019-11-13 LAB — HEPARIN LEVEL (UNFRACTIONATED): Heparin Unfractionated: 0.12 IU/mL — ABNORMAL LOW (ref 0.30–0.70)

## 2019-11-13 LAB — TROPONIN I (HIGH SENSITIVITY)
Troponin I (High Sensitivity): 572 ng/L (ref <18)
Troponin I (High Sensitivity): 2953 ng/L (ref <18)

## 2019-11-13 LAB — COMPREHENSIVE METABOLIC PANEL
ALT: 34 U/L (ref 0–44)
AST: 77 U/L — ABNORMAL HIGH (ref 15–41)
Albumin: 2.2 g/dL — ABNORMAL LOW (ref 3.5–5.0)
Alkaline Phosphatase: 107 U/L (ref 38–126)
Anion gap: 10 (ref 5–15)
BUN: 20 mg/dL (ref 8–23)
CO2: 26 mmol/L (ref 22–32)
Calcium: 7.9 mg/dL — ABNORMAL LOW (ref 8.9–10.3)
Chloride: 101 mmol/L (ref 98–111)
Creatinine, Ser: 0.66 mg/dL (ref 0.61–1.24)
GFR, Estimated: >60 mL/min (ref >60)
Glucose, Bld: 132 mg/dL — ABNORMAL HIGH (ref 70–99)
Potassium: 3.5 mmol/L (ref 3.5–5.1)
Sodium: 137 mmol/L (ref 135–145)
Total Bilirubin: 0.9 mg/dL (ref 0.3–1.2)
Total Protein: 5.6 g/dL — ABNORMAL LOW (ref 6.5–8.1)

## 2019-11-13 LAB — APTT: aPTT: 26 seconds (ref 24–36)

## 2019-11-13 LAB — POCT I-STAT 7, (LYTES, BLD GAS, ICA,H+H)
Acid-Base Excess: 0 mmol/L (ref 0.0–2.0)
Acid-base deficit: 2 mmol/L (ref 0.0–2.0)
Bicarbonate: 29.7 mmol/L — ABNORMAL HIGH (ref 20.0–28.0)
Bicarbonate: 24.6 mmol/L (ref 20.0–28.0)
Calcium, Ion: 1.22 mmol/L (ref 1.15–1.40)
Calcium, Ion: 1.11 mmol/L — ABNORMAL LOW (ref 1.15–1.40)
HCT: 46 % (ref 39.0–52.0)
HCT: 36 % — ABNORMAL LOW (ref 39.0–52.0)
Hemoglobin: 15.6 g/dL (ref 13.0–17.0)
Hemoglobin: 12.2 g/dL — ABNORMAL LOW (ref 13.0–17.0)
O2 Saturation: 98 %
O2 Saturation: 88 %
Potassium: 3.5 mmol/L (ref 3.5–5.1)
Potassium: 3.4 mmol/L — ABNORMAL LOW (ref 3.5–5.1)
Sodium: 140 mmol/L (ref 135–145)
Sodium: 138 mmol/L (ref 135–145)
TCO2: 32 mmol/L (ref 22–32)
TCO2: 26 mmol/L (ref 22–32)
pCO2 arterial: 84.3 mmHg (ref 32.0–48.0)
pCO2 arterial: 38.3 mmHg (ref 32.0–48.0)
pH, Arterial: 7.154 — CL (ref 7.350–7.450)
pH, Arterial: 7.416 (ref 7.350–7.450)
pO2, Arterial: 148 mmHg — ABNORMAL HIGH (ref 83.0–108.0)
pO2, Arterial: 55 mmHg — ABNORMAL LOW (ref 83.0–108.0)

## 2019-11-13 LAB — MAGNESIUM: Magnesium: 2.1 mg/dL (ref 1.7–2.4)

## 2019-11-13 LAB — ECHOCARDIOGRAM LIMITED
AR max vel: 3.01 cm2
AV Area VTI: 2.67 cm2
AV Area mean vel: 2.91 cm2
AV Mean grad: 3 mmHg
AV Peak grad: 4.4 mmHg
Ao pk vel: 1.05 m/s
Area-P 1/2: 3.65 cm2
Height: 68 in
S' Lateral: 3.3 cm
Weight: 2694.9 oz

## 2019-11-13 LAB — MRSA PCR SCREENING: MRSA by PCR: NEGATIVE

## 2019-11-13 LAB — CULTURE, BLOOD (ROUTINE X 2): Special Requests: ADEQUATE

## 2019-11-13 LAB — D-DIMER, QUANTITATIVE: D-Dimer, Quant: >20 ug/mL-FEU — ABNORMAL HIGH (ref 0.00–0.50)

## 2019-11-13 LAB — LACTIC ACID, PLASMA
Lactic Acid, Venous: 3.2 mmol/L (ref 0.5–1.9)
Lactic Acid, Venous: 3 mmol/L (ref 0.5–1.9)

## 2019-11-13 LAB — GLUCOSE, CAPILLARY
Glucose-Capillary: 130 mg/dL — ABNORMAL HIGH (ref 70–99)
Glucose-Capillary: 95 mg/dL (ref 70–99)
Glucose-Capillary: 79 mg/dL (ref 70–99)
Glucose-Capillary: 98 mg/dL (ref 70–99)
Glucose-Capillary: 109 mg/dL — ABNORMAL HIGH (ref 70–99)

## 2019-11-13 LAB — COOXEMETRY PANEL
Carboxyhemoglobin: 0.6 % (ref 0.5–1.5)
Methemoglobin: 0.7 % (ref 0.0–1.5)
O2 Saturation: 84.2 %
Total hemoglobin: 12.6 g/dL (ref 12.0–16.0)

## 2019-11-13 LAB — PROTIME-INR
INR: 1.5 — ABNORMAL HIGH (ref 0.8–1.2)
Prothrombin Time: 17.2 seconds — ABNORMAL HIGH (ref 11.4–15.2)

## 2019-11-13 SURGERY — LEFT HEART CATH AND CORONARY ANGIOGRAPHY
Anesthesia: LOCAL

## 2019-11-13 MED ORDER — PROPOFOL 1000 MG/100ML IV EMUL
5.0000 ug/kg/min | INTRAVENOUS | Status: DC
  Administered 2019-11-13: 15 ug/kg/min via INTRAVENOUS
  Administered 2019-11-13: 40 ug/kg/min via INTRAVENOUS
  Administered 2019-11-13: 15 ug/kg/min via INTRAVENOUS
  Administered 2019-11-14 (×3): 70 ug/kg/min via INTRAVENOUS
  Administered 2019-11-14: 50 ug/kg/min via INTRAVENOUS
  Administered 2019-11-14: 40 ug/kg/min via INTRAVENOUS
  Administered 2019-11-15: 50 ug/kg/min via INTRAVENOUS
  Administered 2019-11-15: 60 ug/kg/min via INTRAVENOUS
  Filled 2019-11-13 (×13): qty 100

## 2019-11-13 MED ORDER — CLOPIDOGREL BISULFATE 75 MG PO TABS
75.0000 mg | ORAL_TABLET | Freq: Once | ORAL | Status: AC
  Administered 2019-11-13: 75 mg
  Filled 2019-11-13: qty 1

## 2019-11-13 MED ORDER — FENTANYL CITRATE (PF) 100 MCG/2ML IJ SOLN
INTRAMUSCULAR | Status: AC
  Administered 2019-11-13: 100 ug via INTRAVENOUS
  Filled 2019-11-13: qty 2

## 2019-11-13 MED ORDER — VECURONIUM BROMIDE 10 MG IV SOLR
INTRAVENOUS | Status: AC
  Filled 2019-11-13: qty 10

## 2019-11-13 MED ORDER — HEPARIN (PORCINE) IN NACL 1000-0.9 UT/500ML-% IV SOLN
INTRAVENOUS | Status: AC
  Filled 2019-11-13: qty 1000

## 2019-11-13 MED ORDER — THIAMINE HCL 100 MG/ML IJ SOLN
100.0000 mg | Freq: Every day | INTRAMUSCULAR | Status: DC
  Administered 2019-11-17: 100 mg via INTRAVENOUS
  Filled 2019-11-13 (×2): qty 2

## 2019-11-13 MED ORDER — LORAZEPAM 2 MG/ML IJ SOLN
1.0000 mg | Freq: Once | INTRAMUSCULAR | Status: AC
  Administered 2019-11-13: 1 mg via INTRAVENOUS

## 2019-11-13 MED ORDER — LIDOCAINE HCL (PF) 1 % IJ SOLN
INTRAMUSCULAR | Status: DC | PRN
  Administered 2019-11-13: 15 mL

## 2019-11-13 MED ORDER — PROSOURCE TF PO LIQD
45.0000 mL | Freq: Two times a day (BID) | ORAL | Status: DC
  Administered 2019-11-13 – 2019-11-14 (×3): 45 mL
  Filled 2019-11-13 (×3): qty 45

## 2019-11-13 MED ORDER — ADULT MULTIVITAMIN W/MINERALS CH
1.0000 | ORAL_TABLET | Freq: Every day | ORAL | Status: DC
  Administered 2019-11-14 – 2019-11-19 (×6): 1
  Filled 2019-11-13 (×6): qty 1

## 2019-11-13 MED ORDER — CHLORHEXIDINE GLUCONATE CLOTH 2 % EX PADS
6.0000 | MEDICATED_PAD | Freq: Every day | CUTANEOUS | Status: DC
  Administered 2019-11-13 – 2019-11-19 (×6): 6 via TOPICAL

## 2019-11-13 MED ORDER — VITAL HIGH PROTEIN PO LIQD
1000.0000 mL | ORAL | Status: DC
  Administered 2019-11-13 – 2019-11-14 (×2): 1000 mL

## 2019-11-13 MED ORDER — MIDAZOLAM HCL 2 MG/2ML IJ SOLN: 2.0000 mg | Freq: Once | INTRAMUSCULAR | Status: AC

## 2019-11-13 MED ORDER — HEPARIN (PORCINE) IN NACL 1000-0.9 UT/500ML-% IV SOLN
INTRAVENOUS | Status: DC | PRN
  Administered 2019-11-13 (×2): 500 mL

## 2019-11-13 MED ORDER — HEPARIN SODIUM (PORCINE) 5000 UNIT/ML IJ SOLN
4000.0000 [IU] | Freq: Once | INTRAMUSCULAR | Status: AC
  Administered 2019-11-13: 4000 [IU] via INTRAVENOUS
  Filled 2019-11-13: qty 1

## 2019-11-13 MED ORDER — CLOPIDOGREL BISULFATE 75 MG PO TABS
300.0000 mg | ORAL_TABLET | Freq: Once | ORAL | Status: AC
  Administered 2019-11-13: 300 mg
  Filled 2019-11-13: qty 4

## 2019-11-13 MED ORDER — MIDAZOLAM HCL 2 MG/2ML IJ SOLN
2.0000 mg | Freq: Once | INTRAMUSCULAR | Status: AC
  Administered 2019-11-13: 2 mg via INTRAVENOUS

## 2019-11-13 MED ORDER — LIDOCAINE HCL (PF) 1 % IJ SOLN
INTRAMUSCULAR | Status: AC
  Filled 2019-11-13: qty 30

## 2019-11-13 MED ORDER — FOLIC ACID 1 MG PO TABS
1.0000 mg | ORAL_TABLET | Freq: Every day | ORAL | Status: DC
  Administered 2019-11-14 – 2019-11-19 (×6): 1 mg
  Filled 2019-11-13 (×6): qty 1

## 2019-11-13 MED ORDER — VERAPAMIL HCL 2.5 MG/ML IV SOLN
INTRAVENOUS | Status: AC
  Filled 2019-11-13: qty 2

## 2019-11-13 MED ORDER — FENTANYL 2500MCG IN NS 250ML (10MCG/ML) PREMIX INFUSION
0.0000 ug/h | INTRAVENOUS | Status: DC
  Administered 2019-11-13: 200 ug/h via INTRAVENOUS
  Administered 2019-11-13: 100 ug/h via INTRAVENOUS
  Administered 2019-11-14: 250 ug/h via INTRAVENOUS
  Administered 2019-11-14 – 2019-11-15 (×2): 400 ug/h via INTRAVENOUS
  Administered 2019-11-15: 300 ug/h via INTRAVENOUS
  Administered 2019-11-15: 325 ug/h via INTRAVENOUS
  Filled 2019-11-13 (×7): qty 250

## 2019-11-13 MED ORDER — IOHEXOL 350 MG/ML SOLN
INTRAVENOUS | Status: DC | PRN
  Administered 2019-11-13: 65 mL

## 2019-11-13 MED ORDER — ASPIRIN 81 MG PO CHEW: 81.0000 mg | CHEWABLE_TABLET | Freq: Every day | ORAL | Status: DC

## 2019-11-13 MED ORDER — SODIUM CHLORIDE 0.9 % IV SOLN
250.0000 mL | INTRAVENOUS | Status: DC | PRN
  Administered 2019-11-13: 250 mL via INTRAVENOUS

## 2019-11-13 MED ORDER — NOREPINEPHRINE BITARTRATE 1 MG/ML IV SOLN
INTRAVENOUS | Status: AC | PRN
  Administered 2019-11-13: 5 ug/min via INTRAVENOUS

## 2019-11-13 MED ORDER — LORAZEPAM 2 MG/ML IJ SOLN
1.0000 mg | INTRAMUSCULAR | Status: DC | PRN
  Administered 2019-11-13: 3 mg via INTRAVENOUS
  Filled 2019-11-13: qty 2

## 2019-11-13 MED ORDER — ASPIRIN 325 MG PO TABS
ORAL_TABLET | ORAL | Status: AC
  Administered 2019-11-13: 325 mg via ORAL
  Filled 2019-11-13: qty 1

## 2019-11-13 MED ORDER — THIAMINE HCL 100 MG PO TABS
100.0000 mg | ORAL_TABLET | Freq: Every day | ORAL | Status: DC
  Administered 2019-11-14 – 2019-11-19 (×5): 100 mg
  Filled 2019-11-13 (×6): qty 1

## 2019-11-13 MED ORDER — ORAL CARE MOUTH RINSE
15.0000 mL | OROMUCOSAL | Status: DC
  Administered 2019-11-13 (×2): 15 mL via OROMUCOSAL

## 2019-11-13 MED ORDER — CHLORHEXIDINE GLUCONATE 0.12% ORAL RINSE (MEDLINE KIT)
15.0000 mL | Freq: Two times a day (BID) | OROMUCOSAL | Status: DC
  Administered 2019-11-13 – 2019-11-19 (×12): 15 mL via OROMUCOSAL

## 2019-11-13 MED ORDER — HEPARIN (PORCINE) 25000 UT/250ML-% IV SOLN
1000.0000 [IU]/h | INTRAVENOUS | Status: DC
  Administered 2019-11-13: 1000 [IU]/h via INTRAVENOUS
  Filled 2019-11-13: qty 250

## 2019-11-13 MED ORDER — DEXAMETHASONE 6 MG PO TABS
6.0000 mg | ORAL_TABLET | ORAL | Status: DC
  Administered 2019-11-13 – 2019-11-19 (×7): 6 mg
  Filled 2019-11-13 (×7): qty 1

## 2019-11-13 MED ORDER — SODIUM CHLORIDE 0.9 % IV SOLN: INTRAVENOUS | Status: AC

## 2019-11-13 MED ORDER — STERILE WATER FOR INJECTION IJ SOLN
INTRAMUSCULAR | Status: AC
  Filled 2019-11-13: qty 10

## 2019-11-13 MED ORDER — SODIUM CHLORIDE 0.9 % IV SOLN: INTRAVENOUS | Status: DC | PRN

## 2019-11-13 MED ORDER — ASPIRIN 325 MG PO TABS: 325.0000 mg | ORAL_TABLET | Freq: Once | ORAL | Status: AC

## 2019-11-13 MED ORDER — PROPOFOL 1000 MG/100ML IV EMUL
INTRAVENOUS | Status: AC
  Administered 2019-11-13: 5 ug/kg/min via INTRAVENOUS
  Filled 2019-11-13: qty 100

## 2019-11-13 MED ORDER — ORAL CARE MOUTH RINSE
15.0000 mL | OROMUCOSAL | Status: DC
  Administered 2019-11-13 – 2019-11-19 (×62): 15 mL via OROMUCOSAL

## 2019-11-13 MED ORDER — NOREPINEPHRINE 4 MG/250ML-% IV SOLN
INTRAVENOUS | Status: AC
  Filled 2019-11-13: qty 250

## 2019-11-13 MED ORDER — FENTANYL CITRATE (PF) 100 MCG/2ML IJ SOLN
100.0000 ug | Freq: Once | INTRAMUSCULAR | Status: AC
  Filled 2019-11-13: qty 2

## 2019-11-13 MED ORDER — NOREPINEPHRINE 16 MG/250ML-% IV SOLN
0.0000 ug/min | INTRAVENOUS | Status: DC
  Administered 2019-11-13 – 2019-11-14 (×2): 30 ug/min via INTRAVENOUS
  Administered 2019-11-14: 33 ug/min via INTRAVENOUS
  Administered 2019-11-14: 32 ug/min via INTRAVENOUS
  Administered 2019-11-15 (×2): 40 ug/min via INTRAVENOUS
  Administered 2019-11-16: 36 ug/min via INTRAVENOUS
  Administered 2019-11-17: 1 ug/min via INTRAVENOUS
  Administered 2019-11-17: 8 ug/min via INTRAVENOUS
  Filled 2019-11-13 (×10): qty 250

## 2019-11-13 MED ORDER — VECURONIUM BROMIDE 10 MG IV SOLR
10.0000 mg | Freq: Once | INTRAVENOUS | Status: AC
  Administered 2019-11-13: 10 mg via INTRAVENOUS

## 2019-11-13 MED ORDER — LORAZEPAM BOLUS VIA INFUSION: 1.0000 mg | Freq: Once | INTRAVENOUS | Status: DC

## 2019-11-13 MED ORDER — ASPIRIN 81 MG PO CHEW
81.0000 mg | CHEWABLE_TABLET | Freq: Every day | ORAL | Status: DC
  Administered 2019-11-14 – 2019-11-19 (×6): 81 mg
  Filled 2019-11-13 (×6): qty 1

## 2019-11-13 MED ORDER — HEPARIN BOLUS VIA INFUSION: 4000.0000 [IU] | Freq: Once | INTRAVENOUS | Status: DC

## 2019-11-13 MED ORDER — MIDAZOLAM HCL 2 MG/2ML IJ SOLN
INTRAMUSCULAR | Status: AC
  Filled 2019-11-13: qty 4

## 2019-11-13 MED ORDER — EPINEPHRINE HCL 5 MG/250ML IV SOLN IN NS
0.5000 ug/min | INTRAVENOUS | Status: DC
  Administered 2019-11-13: 3 ug/min via INTRAVENOUS
  Administered 2019-11-14 (×2): 5 ug/min via INTRAVENOUS
  Administered 2019-11-15 (×2): 20 ug/min via INTRAVENOUS
  Administered 2019-11-16: 02:00:00 16 ug/min via INTRAVENOUS
  Administered 2019-11-16: 21:00:00 5 ug/min via INTRAVENOUS
  Administered 2019-11-16: 9 ug/min via INTRAVENOUS
  Administered 2019-11-16: 06:00:00 10 ug/min via INTRAVENOUS
  Filled 2019-11-13 (×4): qty 250
  Filled 2019-11-13: qty 500
  Filled 2019-11-13 (×5): qty 250

## 2019-11-13 MED ORDER — HEPARIN (PORCINE) 25000 UT/250ML-% IV SOLN
2000.0000 [IU]/h | INTRAVENOUS | Status: DC
  Administered 2019-11-13: 1000 [IU]/h via INTRAVENOUS
  Administered 2019-11-15 (×2): 1800 [IU]/h via INTRAVENOUS
  Administered 2019-11-16: 2000 [IU]/h via INTRAVENOUS
  Filled 2019-11-13 (×5): qty 250

## 2019-11-13 MED ORDER — NOREPINEPHRINE 4 MG/250ML-% IV SOLN
0.0000 ug/min | INTRAVENOUS | Status: DC
  Administered 2019-11-13: 12 ug/min via INTRAVENOUS
  Administered 2019-11-13 (×2): 30 ug/min via INTRAVENOUS
  Administered 2019-11-13: 5 ug/min via INTRAVENOUS
  Filled 2019-11-13: qty 500
  Filled 2019-11-13: qty 250

## 2019-11-13 MED ORDER — MIDAZOLAM HCL 2 MG/2ML IJ SOLN
INTRAMUSCULAR | Status: AC
  Administered 2019-11-13: 2 mg via INTRAVENOUS
  Filled 2019-11-13: qty 2

## 2019-11-13 MED ORDER — LORAZEPAM 1 MG PO TABS: 1.0000 mg | ORAL_TABLET | ORAL | Status: DC | PRN

## 2019-11-13 MED ORDER — ADULT MULTIVITAMIN W/MINERALS CH
1.0000 | ORAL_TABLET | Freq: Every day | ORAL | Status: DC
  Administered 2019-11-13: 1 via ORAL
  Filled 2019-11-13: qty 1

## 2019-11-13 MED ORDER — FENTANYL BOLUS VIA INFUSION
25.0000 ug | INTRAVENOUS | Status: DC | PRN
  Administered 2019-11-13 – 2019-11-15 (×7): 25 ug via INTRAVENOUS
  Filled 2019-11-13: qty 25

## 2019-11-13 MED ORDER — SODIUM CHLORIDE 0.9% FLUSH
3.0000 mL | Freq: Two times a day (BID) | INTRAVENOUS | Status: DC
  Administered 2019-11-14 – 2019-11-16 (×3): 3 mL via INTRAVENOUS

## 2019-11-13 MED ORDER — ATORVASTATIN CALCIUM 80 MG PO TABS
80.0000 mg | ORAL_TABLET | Freq: Every day | ORAL | Status: DC
  Administered 2019-11-13: 80 mg via ORAL
  Filled 2019-11-13: qty 1

## 2019-11-13 MED ORDER — THIAMINE HCL 100 MG PO TABS
100.0000 mg | ORAL_TABLET | Freq: Every day | ORAL | Status: DC
  Administered 2019-11-13: 100 mg via ORAL
  Filled 2019-11-13: qty 1

## 2019-11-13 MED ORDER — FOLIC ACID 1 MG PO TABS
1.0000 mg | ORAL_TABLET | Freq: Every day | ORAL | Status: DC
  Administered 2019-11-13: 1 mg via ORAL
  Filled 2019-11-13: qty 1

## 2019-11-13 MED ORDER — DEXMEDETOMIDINE HCL IN NACL 400 MCG/100ML IV SOLN
0.4000 ug/kg/h | INTRAVENOUS | Status: DC
  Filled 2019-11-13 (×2): qty 100

## 2019-11-13 MED ORDER — THIAMINE HCL 100 MG/ML IJ SOLN: 100.0000 mg | Freq: Every day | INTRAMUSCULAR | Status: DC

## 2019-11-13 MED ORDER — LORAZEPAM 2 MG/ML IJ SOLN
INTRAMUSCULAR | Status: AC
  Filled 2019-11-13: qty 1

## 2019-11-13 MED ORDER — ETOMIDATE 2 MG/ML IV SOLN
20.0000 mg | Freq: Once | INTRAVENOUS | Status: AC
  Administered 2019-11-13: 20 mg via INTRAVENOUS

## 2019-11-13 MED ORDER — ATORVASTATIN CALCIUM 80 MG PO TABS
80.0000 mg | ORAL_TABLET | Freq: Every day | ORAL | Status: DC
  Administered 2019-11-14 – 2019-11-16 (×3): 80 mg
  Filled 2019-11-13 (×3): qty 1

## 2019-11-13 MED ORDER — HYDRALAZINE HCL 20 MG/ML IJ SOLN: 10.0000 mg | INTRAMUSCULAR | Status: AC | PRN

## 2019-11-13 MED ORDER — NITROGLYCERIN 1 MG/10 ML FOR IR/CATH LAB
INTRA_ARTERIAL | Status: AC
  Filled 2019-11-13: qty 10

## 2019-11-13 MED ORDER — ROCURONIUM BROMIDE 10 MG/ML (PF) SYRINGE
100.0000 mg | PREFILLED_SYRINGE | Freq: Once | INTRAVENOUS | Status: AC
  Administered 2019-11-13: 100 mg via INTRAVENOUS

## 2019-11-13 MED ORDER — SODIUM CHLORIDE 0.9 % BOLUS PEDS
1000.0000 mL | Freq: Once | INTRAVENOUS | Status: AC
  Administered 2019-11-13: 1000 mL via INTRAVENOUS

## 2019-11-13 MED ORDER — IPRATROPIUM-ALBUTEROL 0.5-2.5 (3) MG/3ML IN SOLN: 3.0000 mL | RESPIRATORY_TRACT | Status: DC | PRN

## 2019-11-13 MED ORDER — FAMOTIDINE IN NACL 20-0.9 MG/50ML-% IV SOLN
20.0000 mg | Freq: Two times a day (BID) | INTRAVENOUS | Status: DC
  Administered 2019-11-13 – 2019-11-14 (×3): 20 mg via INTRAVENOUS
  Filled 2019-11-13 (×3): qty 50

## 2019-11-13 MED ORDER — LABETALOL HCL 5 MG/ML IV SOLN: 10.0000 mg | INTRAVENOUS | Status: AC | PRN

## 2019-11-13 MED ORDER — SODIUM CHLORIDE 0.9% FLUSH: 3.0000 mL | INTRAVENOUS | Status: DC | PRN

## 2019-11-13 MED ORDER — ROSUVASTATIN CALCIUM 5 MG PO TABS: 20.0000 mg | ORAL_TABLET | Freq: Every day | ORAL | Status: DC

## 2019-11-13 MED ORDER — CHLORHEXIDINE GLUCONATE 0.12% ORAL RINSE (MEDLINE KIT)
15.0000 mL | Freq: Two times a day (BID) | OROMUCOSAL | Status: DC
  Administered 2019-11-13: 15 mL via OROMUCOSAL

## 2019-11-13 MED ORDER — IOHEXOL 350 MG/ML SOLN
INTRAVENOUS | Status: AC
  Filled 2019-11-13: qty 1

## 2019-11-13 MED ORDER — SODIUM CHLORIDE 0.9 % IV BOLUS
500.0000 mL | INTRAVENOUS | Status: DC | PRN
  Administered 2019-11-13 – 2019-11-17 (×2): 500 mL via INTRAVENOUS

## 2019-11-13 SURGICAL SUPPLY — 16 items
CATH INFINITI JR4 5F (CATHETERS) ×1 IMPLANT
CATH LAUNCHER 6FR AL1 (CATHETERS) IMPLANT
CATH VISTA GUIDE 6FR XBLAD3.5 (CATHETERS) ×1 IMPLANT
CATHETER LAUNCHER 6FR AL1 (CATHETERS) ×2
CLOSURE MYNX CONTROL 6F/7F (Vascular Products) ×1 IMPLANT
KIT ENCORE 26 ADVANTAGE (KITS) ×1 IMPLANT
KIT HEART LEFT (KITS) ×2 IMPLANT
KIT MICROPUNCTURE NIT STIFF (SHEATH) ×1 IMPLANT
PACK CARDIAC CATHETERIZATION (CUSTOM PROCEDURE TRAY) ×2 IMPLANT
SHEATH PINNACLE 6F 10CM (SHEATH) ×1 IMPLANT
SHEATH PROBE COVER 6X72 (BAG) ×1 IMPLANT
SYR MEDRAD MARK 7 150ML (SYRINGE) ×2 IMPLANT
TRANSDUCER W/STOPCOCK (MISCELLANEOUS) ×2 IMPLANT
TUBING CIL FLEX 10 FLL-RA (TUBING) ×2 IMPLANT
WIRE COUGAR XT STRL 190CM (WIRE) ×1 IMPLANT
WIRE EMERALD 3MM-J .035X150CM (WIRE) ×1 IMPLANT

## 2019-11-13 NOTE — Procedures (Signed)
Central Venous Catheter Insertion Procedure Note  Charles Dawson  [id number]  1955-09-05  Date:11/23/19  Time:2:37 PM   Provider Performing:Jowanna Loeffler   Procedure: Insertion of Non-tunneled Central Venous Catheter(36556) with US guidance (36629)   Indication(s) Medication administration and Difficult access  Consent Unable to obtain consent due to emergent nature of procedure.  Anesthesia Topical only with 1% lidocaine   Timeout Verified patient identification, verified procedure, site/side was marked, verified correct patient position, special equipment/implants available, medications/allergies/relevant history reviewed, required imaging and test results available.  Sterile Technique Maximal sterile technique including full sterile barrier drape, hand hygiene, sterile gown, sterile gloves, mask, hair covering, sterile ultrasound probe cover (if used).  Procedure Description Area of catheter insertion was cleaned with chlorhexidine and draped in sterile fashion.  With real-time ultrasound guidance a central venous catheter was placed into the right subclavian vein. Nonpulsatile blood flow and easy flushing noted in all ports.  The catheter was sutured in place and sterile dressing applied.      Complications/Tolerance None; patient tolerated the procedure well. Chest X-ray is ordered to verify placement for internal jugular or subclavian cannulation.   Chest x-ray is not ordered for femoral cannulation.  EBL Minimal  Specimen(s) None

## 2019-11-13 NOTE — Therapy (Signed)
Critical ABG results reported to [facility] at 0536. Critical values:  PH 7.15 PCO2 84.3

## 2019-11-13 NOTE — Progress Notes (Signed)
Cardiology Cath Note:   He had worsening hypoxia and agitation this am leading to intubation. EKG with anterior ST elevation. Emergent cardiac cath with flow down all vessels but high grade mid LAD stenosis. 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 A covered stent would not be a good option as the Diagonal flow would be lost if the covered stent was placed in the LAD.  His CAD will be managed conservatively for now.  He will need to be seen by CT surgery this week to consider bypass once his acute infectious illness is improved.  His chest x-ray shows possible edema but LVEDP was 11.   -Consider IV Lasix today  -Echo today -Continue Levophed -IV heparin 8 hours post sheath pull -Continue ASA and start a statin -Cardiology team will follow. Please call our on call team listed in Amion today if needed.   His brother Roe Coombs was updated by telephone following his cath.   Verne Carrow 11/22/2019 8:00 AM

## 2019-11-13 NOTE — Progress Notes (Signed)
Patient had increased oxygen demand and became confused and more agitated and restless, diaphoretic, accessory muscles in use. Patient had received another dose of Ativan per CIWA protocol with no relief.  Rapid response was called. PM physician arrived to bedside.  Decision made to transfer patient to ICU.  Patient transferred to ICU at 0420.

## 2019-11-13 NOTE — Progress Notes (Signed)
ANTICOAGULATION CONSULT NOTE   Pharmacy Consult for heparin Indication: code STEMI  Assessment: 64yo male had been admitted 10/15 for Covid PNA, decompensated overnight and was tx'd to the ICU and intubated, ECG reveals STEMI, to start heparin while awaiting transfer to the cath lab.  Patient went to cath lab, pharmacy reconsulted to start heparin 8 hours after sheath pulled Initial heparin level 0.12 units/ml  Goal of Therapy:  Heparin level 0.3-0.7 units/ml Monitor platelets by anticoagulation protocol: Yes   Plan:  Increase heparin dip to 1200 units/ml HL 6 hours qam HL and CBC  Thanks for allowing pharmacy to be a part of this patient's care.  Talbert Cage, PharmD Clinical Pharmacist 11/16/2019, 11:06 PM

## 2019-11-13 NOTE — Progress Notes (Signed)
CRITICAL VALUE ALERT  Critical Value:  Troponin 572  Date & Time Notied:  11/22/2019 6:24 AM   Provider Notified: Arsenio Loader, MD  Orders Received/Actions taken: Code STEMI already in progress, pt to cath lab at this time

## 2019-11-13 NOTE — Progress Notes (Signed)
eLink Physician-Brief Progress Note Patient Name: Charles Dawson DOB: February 15, 1955 MRN: [id number]   Date of Service  November 20, 2019  HPI/Events of Note  ABG on 70%/PRVC 20/TV 520/P 14 = 7.13/84/148/29.1.  eICU Interventions  Plan: 1. Increase PRVC rate to 30. 2. Repeat ABG at 7:30 AM.     Intervention Category Major Interventions: Acid-Base disturbance - evaluation and management;Respiratory failure - evaluation and management  [name] 11-20-2019, 5:50 AM

## 2019-11-13 NOTE — Consult Note (Signed)
NAME:  Charles Dawson, MRN:  [id number], DOB:  10-07-1955, LOS: 2 ADMISSION DATE:  11/05/2019, CONSULTATION DATE: 11/18/19 REFERRING MD: Internal medicine residency service, CHIEF COMPLAINT: Patient with COVID-19  Brief History   Patient is a 64 year old admitted yesterday for complaints of shortness of breath hypoxemia found to be Covid positive with COVID pneumonia by chest x-ray and clinical exam.  History of present illness   Patient is a 64 year old admitted yesterday for complaints of shortness of breath hypoxemia found to be Covid positive with COVID pneumonia by chest x-ray and clinical exam.  Patient was started on remdesivir and Decadron. I was asked to see the patient urgently due to desaturation.  The patient had earlier in the evening become combative.  He has a questionable history of heavy alcohol abuse.  He drinks about 4 cases of beer a week.  The general feeling at that time was that he was in alcohol withdrawal with worsening hypoxemia due to his Covid pneumonia.  On my evaluation the patient was extremely agitated pulling his oxygen off oxygen saturations in the mid 70s.  Is urgently transferred to the ICU and intubated.  During intubation his oxygen saturations remained in the 60s.  At the start of intubation his oxygen saturation was 70%.  After the ET tube was placed there was significant frothy red secretions.  Follow-up chest x-ray is still pending. Intubation the patient was placed on PEEP of 14, 100% oxygen saturation came back up into the 90s.  Past Medical History   Past Medical History:  Diagnosis Date  . Anxiety   . 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  Patient will need surgical revascularization once medical condition improves.  Low LVEDP.  Objective   Blood pressure (!) 75/59, pulse 91, temperature (!) 96.3 F (35.7 C), temperature source Axillary, resp. rate (!) 34, height 5\' 8"  (1.727 m), weight 76.4 kg, SpO2 97 %.    Vent Mode: PRVC FiO2 (%):  [70 %-100 %] 100 % Set Rate:  [20 bmp-30 bmp] 30 bmp Vt Set:  [520 mL] 520 mL PEEP:  [14 cmH20] 14 cmH20 Plateau Pressure:  [29 cmH20] 29 cmH20   Intake/Output Summary (Last 24 hours) at 11-18-2019 0946 Last data filed at Nov 18, 2019 0700 Gross per 24 hour  Intake 279.17 ml  Output 876 ml  Net -596.83 ml   Filed Weights   11/18/2019 1544 11/18/2019 0545  Weight: 73.2 kg 76.4 kg    Examination: General: Thin ill kempt man intubated. HENT: ET tube and OG tube in place Lungs: Clear anteriorly.  Spontaneous breathing with accessory muscle use spite mechanical ventilation Cardiovascular: Extremities warm heart sounds are unremarkable. Abdomen: Benign bowel sounds positive abdomen is soft Extremities: No edema Neuro: Sedated no response to painful stimuli. GU: Catheter in place  Resolved Hospital Problem list   NA  Assessment & Plan:  Critically ill due to hypoxemia secondary to Covid pneumonia.  Was not intubated or on high oxygen settings prior to STEMI.  Therefore, may be extubatable  -Wake up assessment, will add fentanyl and wean propofol may also be more hemodynamically stable -SBT and move towards possible extubation.  ST elevation  MI-not amenable to percutaneous revascularization. -Start medical management: Aspirin, clopidogrel and rosuvastatin started. -Echocardiogram to assess LV function -Initiate beta-blocker, ACE inhibitor as blood pressure and heart rate allow. -Surgical assessment once Covid resolves.  Possible alcohol withdrawal alcohol withdrawal, however may have been symptoms of myocardial  infarction. -Hold CIWA protocol and wean sedation then reassess mental status.  Covid pneumonia -Continue remdesivir and Decadron  Persistent lactic acidosis -Continue to follow  Elevated D-dimer - We will check venous ultrasound, ETA at time of admission was negative for pulmonary embolus  Best practice:  Diet: N.p.o. - hold on starting tube feeds pending possible extubation. Pain/Anxiety/Delirium protocol (if indicated): Wean propofol use IV fentanyl. Target RASS -1 VAP protocol (if indicated): Yes DVT prophylaxis: Lovenox GI prophylaxis: Pepcid Glucose control: Monitor Mobility: Bedrest Code Status: Full code Family Communication: Available Disposition: ICU for care  Labs   CBC: Recent Labs  Lab 2019-11-25 1034 2019-11-25 1655 11/12/19 0134 11/02/2019 0500 10/31/2019 0516  WBC 12.6* 10.3 7.2 19.2*  --   NEUTROABS 12.5*  --  5.5 16.4*  --   HGB 13.9 13.8 11.9* 15.2 15.6  HCT 40.5 40.6 34.6* 45.5 46.0  MCV 93.1 92.7 93.5 94.2  --   PLT 224 214 227 204  --     Basic Metabolic Panel: Recent Labs  Lab Nov 25, 2019 1034 25-Nov-2019 1655 11/12/19 0134 11/12/2019 0500 11/09/2019 0516  NA 130*  --  133* 137 140  K 3.7  --  3.8 3.5 3.5  CL 93*  --  99 101  --   CO2 26  --  24 26  --   GLUCOSE 111*  --  131* 132*  --   BUN 24*  --  20 20  --   CREATININE 0.89 0.76 0.73 0.66  --   CALCIUM 8.0*  --  7.8* 7.9*  --   MG  --   --  2.1 2.1  --   PHOS  --   --  3.1 3.9  --    GFR: Estimated Creatinine Clearance: 87.9 mL/min (by C-G formula based on SCr of 0.66 mg/dL). Recent Labs  Lab Nov 25, 2019 1034 11-25-2019 1034 11-25-2019 1655 11/25/2019 1655 25-Nov-2019 2044 11/12/19 0134 11/12/19 0455 11/20/2019 0500 11/12/2019 0513  PROCALCITON 0.56  --   --   --   --   --   --   --   --   WBC 12.6*  --  10.3  --   --  7.2  --  19.2*  --   LATICACIDVEN 2.4*   < > 2.8*   < > 3.4* 2.4* 2.2*  --  3.2*   < > = values in this interval not displayed.    Liver Function Tests: Recent Labs  Lab  11/25/2019 1034 11/12/19 0134 11/15/2019 0500  AST 76* 61* 77*  ALT 32 27 34  ALKPHOS 70 57 107  BILITOT 0.7 0.6 0.9  PROT 5.5* 5.2* 5.6*  ALBUMIN 2.2* 2.0* 2.2*   No results for input(s): LIPASE, AMYLASE in the last 168 hours. No results for input(s): AMMONIA in the last 168 hours.  ABG    Component Value Date/Time   PHART 7.154 (LL) 11/23/2019 0516   PCO2ART 84.3 (HH) 11/21/2019 0516   PO2ART 148 (H) 11/06/2019 0516   HCO3 29.7 (H) 10/30/2019 0516   TCO2 32 11/24/2019 0516   ACIDBASEDEF 2.0 11/05/2019 0516   O2SAT 98.0 11/03/2019 0516     Coagulation Profile: Recent Labs  Lab 11/04/2019 0500  INR  1.5*    Cardiac Enzymes: No results for input(s): CKTOTAL, CKMB, CKMBINDEX, TROPONINI in the last 168 hours.  HbA1C: No results found for: HGBA1C  CBG: Recent Labs  Lab 11/17/2019 0524 11-17-19 0750  GLUCAP 130* 95    Review of Systems:   Unable to obtain  Past Medical History  He,  has a past medical history of Anxiety and Empyema (HCC) (12/27/2011).   Surgical History    Past Surgical History:  Procedure Laterality Date  . DENTAL SURGERY    . VIDEO ASSISTED THORACOSCOPY (VATS)/EMPYEMA  12/27/2011   Procedure: VIDEO ASSISTED THORACOSCOPY (VATS)/EMPYEMA;  Surgeon: Purcell Nails, MD;  Location: [facility] OR;  Service: Thoracic;  Laterality: Left;  Marland Kitchen VIDEO BRONCHOSCOPY  12/27/2011   Procedure: VIDEO BRONCHOSCOPY;  Surgeon: Purcell Nails, MD;  Location: [facility] OR;  Service: Thoracic;  Laterality: N/A;     Social History   reports that he has quit smoking. He does not have any smokeless tobacco history on file. He reports current alcohol use of about 84.0 standard drinks of alcohol per week. He reports current drug use.   Family History   His family history is not on file.   Allergies No Known Allergies   Home Medications  Prior to Admission medications   Medication Sig Start Date End Date Taking? Authorizing Provider  Aspirin-Acetaminophen-Caffeine (GOODY HEADACHE  PO) Take 1 packet by mouth as needed (headache.).   Yes [provider]  ALPRAZolam Prudy Feeler) 0.5 MG tablet Take 0.5 mg by mouth 3 (three) times daily. Patient not taking: Reported on 10/28/2019    [provider]  aspirin 81 MG chewable tablet Chew 324 mg by mouth once. Patient not taking: Reported on 10/30/2019    [provider]  oxyCODONE-acetaminophen (PERCOCET) 7.5-325 MG per tablet Take 1 tablet by mouth every 8 (eight) hours as needed for pain. Patient not taking: Reported on 11/23/2019 01/26/12   Wilmon Pali, PA-C    CRITICAL CARE Performed by: Lynnell Catalan   Total critical care time: 40 minutes  Critical care time was exclusive of separately billable procedures and treating other patients.  Critical care was necessary to treat or prevent imminent or life-threatening deterioration.  Critical care was time spent personally by me on the following activities: development of treatment plan with patient and/or surrogate as well as nursing, discussions with consultants, evaluation of patient's response to treatment, examination of patient, obtaining history from patient or surrogate, ordering and performing treatments and interventions, ordering and review of laboratory studies, ordering and review of radiographic studies, pulse oximetry, re-evaluation of patient's condition and participation in multidisciplinary rounds.  Lynnell Catalan, MD [facility] ICU Physician [facility] Critical [facility]  Pager: [phone] Mobile: [phone] After hours: [phone].

## 2019-11-13 NOTE — Progress Notes (Signed)
  Echocardiogram 2D Echocardiogram has been performed.  Gerda Diss 11/03/2019, 11:22 AM

## 2019-11-13 NOTE — Progress Notes (Signed)
VASCULAR LAB    Bilateral lower extremity venous duplex has been performed.  See CV proc for preliminary results.  Messaged Dr. Denese Killings with results.  Inetta Dicke, RVT 11/07/2019, 12:16 PM

## 2019-11-13 NOTE — Progress Notes (Signed)
PM provider paged.  Patient anxious, restless, unable to maintain oxygen saturation.  Patient on venti mask, unable to tolerate nasal cannula.  Advised progress notes reference CIWA scale, not ordered.  Patient admits to drinking alcohol daily, concerned about possible withdrawal.  See orders

## 2019-11-13 NOTE — Progress Notes (Signed)
Family notified patient transferred to ICU.  Contacted brother Leverne Amrhein, advised patient in ICU and requested he contacted other family who may need to know.  Room number given to family member.

## 2019-11-13 NOTE — Progress Notes (Signed)
ANTICOAGULATION CONSULT NOTE - Initial Consult  Pharmacy Consult for heparin Indication: code STEMI  No Known Allergies  Patient Measurements: Height: 5\' 8"  (172.7 cm) Weight: 76.4 kg (168 lb 6.9 oz) IBW/kg (Calculated) : 68.4  Vital Signs: Temp: 99 F (37.2 C) (10/17 0545) Temp Source: Oral (10/17 0545) BP: 98/68 (10/17 0545) Pulse Rate: 112 (10/17 0545)  Labs: Recent Labs    11/17/2019 1034 10/31/2019 1034 11/10/2019 1328 11/27/2019 1655 11/10/2019 1655 11/12/19 0134 11/12/19 0134 11/24/2019 0500 11/20/2019 0516  HGB 13.9   < >  --  13.8   < > 11.9*   < > 15.2 15.6  HCT 40.5   < >  --  40.6   < > 34.6*  --  45.5 46.0  PLT 224   < >  --  214  --  227  --  204  --   CREATININE 0.89   < >  --  0.76  --  0.73  --  0.66  --   TROPONINIHS 33*  --  24*  --   --   --   --   --   --    < > = values in this interval not displayed.    Estimated Creatinine Clearance: 87.9 mL/min (by C-G formula based on SCr of 0.66 mg/dL).   Medical History: Past Medical History:  Diagnosis Date  . Anxiety   . Empyema (HCC) 12/27/2011    Assessment: 64yo male had been admitted 10/15 for Covid PNA, decompensated overnight and was tx'd to the ICU and intubated, ECG reveals STEMI, to start heparin while awaiting transfer to the cath lab.  Goal of Therapy:  Heparin level 0.3-0.7 units/ml Monitor platelets by anticoagulation protocol: Yes   Plan:  Rec'd heparin 4000 units IV bolus; will start heparin gtt at 1000 units/hr and f/u after cardiac cath.  11/15, PharmD, BCPS  11/14/2019,6:08 AM

## 2019-11-13 NOTE — Progress Notes (Signed)
Dr Denese Killings notified after arterial line placement that pts MAPs still low despite titrating up on Levophed. CVP now 8 after 1500 mls of NS.  Order for another liter of NS and then to start Epi drip at 3 after bolus.

## 2019-11-13 NOTE — Progress Notes (Signed)
Paged around 3:30 for concern of continued agitation despite 3 mg ativan over the last 30 minutes, as well as increasing oxygen requirements.  Returned to bedside. Rapid response had already arrived. Patient's mental status had worsened, and he was constantly attempting to pull off oxygen mask. At this point he is requiring 50 L of HHFNC plus NRB. Due to symptoms of worsening alcohol withdrawal and risk of further decompensation, the critical care team was consulted.   PCCM physician promptly arrived to bedside and agreed that he required transfer to the ICU for precedex. Appreciate CCM consultation, as well as support from rapid response team.   Lenward Chancellor D PGY-3 11/26/2019, 4:15 am

## 2019-11-13 NOTE — Progress Notes (Signed)
ANTICOAGULATION CONSULT NOTE - Initial Consult  Pharmacy Consult for heparin Indication: code STEMI  No Known Allergies  Patient Measurements: Height: 5\' 8"  (172.7 cm) Weight: 76.4 kg (168 lb 6.9 oz) IBW/kg (Calculated) : 68.4  Vital Signs: Temp: 99 F (37.2 C) (10/17 0545) Temp Source: Oral (10/17 0545) BP: 93/64 (10/17 0726) Pulse Rate: 0 (10/17 0736)  Labs: Recent Labs    2019-12-02 1034 12-02-19 1034 12/02/2019 1328 2019/12/02 1655 Dec 02, 2019 1655 11/12/19 0134 11/12/19 0134 11/07/2019 0500 11/12/2019 0516  HGB 13.9   < >  --  13.8   < > 11.9*   < > 15.2 15.6  HCT 40.5   < >  --  40.6   < > 34.6*  --  45.5 46.0  PLT 224   < >  --  214  --  227  --  204  --   APTT  --   --   --   --   --   --   --  26  --   LABPROT  --   --   --   --   --   --   --  17.2*  --   INR  --   --   --   --   --   --   --  1.5*  --   CREATININE 0.89   < >  --  0.76  --  0.73  --  0.66  --   TROPONINIHS 33*  --  24*  --   --   --   --  572*  --    < > = values in this interval not displayed.    Estimated Creatinine Clearance: 87.9 mL/min (by C-G formula based on SCr of 0.66 mg/dL).   Medical History: Past Medical History:  Diagnosis Date  . Anxiety   . Empyema (HCC) 12/27/2011    Assessment: 64yo male had been admitted 10/15 for Covid PNA, decompensated overnight and was tx'd to the ICU and intubated, ECG reveals STEMI, to start heparin while awaiting transfer to the cath lab.  Patient went to cath lab, pharmacy reconsulted to start heparin 8 hours after sheath pulled  Goal of Therapy:  Heparin level 0.3-0.7 units/ml Monitor platelets by anticoagulation protocol: Yes   Plan:  Restart heparin gtt at 1000 units/hr at 1530 HL 6 hours after restart (at 2130) qam HL and CBC  2131, PharmD, [facility] Clinical Pharmacist Please see AMION for all Pharmacists' Contact Phone Numbers 11/08/2019, 8:08 AM

## 2019-11-13 NOTE — Progress Notes (Signed)
Ativan given previously not effective. Patient remains agitated, restless, continues to pull off his oxygen mask and attempts to get up.  Patient on HFNC and venti mask.  Respiratory therapy to place on heated HFNC.  Physican rounded on patient, patient answers orientation questions appriorpriately.  Orders to follow.

## 2019-11-13 NOTE — Significant Event (Signed)
Rapid Response Event Note   Reason for Call :  Called by [facility] RN to evaluate pt for respiratory distress/agitation.  Initial Focused Assessment:  Pt thrashing back and forth in bed, pulling off oxygen. Pt is alert to self only. Pt will follow some commands. Pt moves all extremities equally. Pt requiring multiple staff members to keep his oxygen on. Pt is tachypneic, using accessory muscles to breathe. Lungs diminished t/o. Skin warm and dry. HR-109, BP-132/85, RR-40s, SpO2-mid 80s on 50L HHFNC 100% and NRB.   Pt increased to 60L HHFNC with no change in SpO2.   Interventions:  PCXR ABG Dr. Wallace Keller to bedside, PCCM consulted. Pt moved to 3M04 and intubated. Plan of Care:  Pt moved to 3M04.   Event Summary:   MD Notified: Dr. Chesley Mires notified and came to bedside.  Call Time:0305 Arrival [id number] End [id number]  Terrilyn Saver, RN

## 2019-11-13 NOTE — Progress Notes (Signed)
Brief Nutrition Note  Consult received for enteral/tube feeding initiation and management.  Adult Enteral Nutrition Protocol initiated. Full assessment to follow.  Pt with OG tube in place.  Admitting Dx: Acute respiratory failure with hypoxia (HCC) [J96.01] COVID-19 [U07.1]  Body mass index is 25.61 kg/m. Pt meets criteria for overweight based on current BMI.  Labs:  Recent Labs  Lab 12/07/2019 1034 12/07/2019 1034 Dec 07, 2019 1655 11/12/19 0134 10/28/2019 0500 11/18/2019 0516  NA 130*   < >  --  133* 137 140  K 3.7   < >  --  3.8 3.5 3.5  CL 93*  --   --  99 101  --   CO2 26  --   --  24 26  --   BUN 24*  --   --  20 20  --   CREATININE 0.89   < > 0.76 0.73 0.66  --   CALCIUM 8.0*  --   --  7.8* 7.9*  --   MG  --   --   --  2.1 2.1  --   PHOS  --   --   --  3.1 3.9  --   GLUCOSE 111*  --   --  131* 132*  --    < > = values in this interval not displayed.    Earma Reading, MS, RD, LDN Inpatient Clinical Dietitian Please see AMiON for contact information.

## 2019-11-13 NOTE — Progress Notes (Signed)
Paged by nurse due to concern for significant desaturations with any type of movement and concern for possible alcohol withdrawal complicating his clinical status.  Calculated CIWA by nursing was 13. 1x dose of Ativan 1 mg was given without much improvement.  Went to evaluate at bedside. Patient had just gotten up and desaturated to the 60s. Nurse and rapid response team placed [place] and NRB with slow improvement in O2 sats. On my evaluation, he was saturating in low 90s. He was able to converse with me and answer questions appropriately. Says he is having a difficult time with anxiety and continuously shifting around in bed and is tremulous.  Plan -suspect he is exhibiting alcohol withdrawal -will initiate stepdown CIWA protocol -continue close monitoring of respiratory status -remains high risk for decompensation   Bridget Hartshorn, DO PGY-3 2019/11/15, 2:37 am

## 2019-11-13 NOTE — Procedures (Signed)
Arterial Catheter Insertion Procedure Note  Charles Dawson  [id number]  01-29-1955  Date:12-06-19  Time:6:47 PM    Provider Performing: Carolan Shiver    Procedure: Insertion of Arterial Line (74259) without US guidance  Indication(s) Blood pressure monitoring and/or need for frequent ABGs  Consent Unable to obtain consent due to emergent nature of procedure.  Anesthesia None   Time Out Verified patient identification, verified procedure, site/side was marked, verified correct patient position, special equipment/implants available, medications/allergies/relevant history reviewed, required imaging and test results available.   Sterile Technique Maximal sterile technique including full sterile barrier drape, hand hygiene, sterile gown, sterile gloves, mask, hair covering, sterile ultrasound probe cover (if used).   Procedure Description Area of catheter insertion was cleaned with chlorhexidine and draped in sterile fashion. Without real-time ultrasound guidance an arterial catheter was placed into the left radial artery.  Appropriate arterial tracings confirmed on monitor.     Complications/Tolerance None; patient tolerated the procedure well.   EBL Minimal   Specimen(s) None

## 2019-11-13 NOTE — Therapy (Addendum)
ABG results:  pH7 7.154 pCO2 84.3 PO2 148 BE -2 HCO3 29.7 TCO2 32 sO2 98%

## 2019-11-13 NOTE — Procedures (Signed)
Endotracheal Intubation Procedure Note  Indication for endotracheal intubation: impending respiratory failure. Airway Assessment: Mallampati Class: II (hard and soft palate, upper portion of tonsils anduvula visible). Sedation: etomidate, fentanyl and midazolam. Paralytic: none. Lidocaine: yes. Atropine: no. Equipment: Macintosh 3 laryngoscope blade. Cricoid Pressure: no. Number of attempts: 1. ETT location confirmed by by auscultation and ETCO2 monitor.  Norman Clay 11/15/2019

## 2019-11-13 NOTE — Code Documentation (Signed)
Responded to Code STEMI called at 0459 d/t STE in ANT leads. Pt has 2 PIVs, zoll pads in place and attached to zoll, and staff are in the process of clipping pt's groin and marking pulses. Cardiology MD on way to see pt.   0537-Plan to take pt to cath lab. Pt already prepped and to be transported by ICU staff.

## 2019-11-13 NOTE — Consult Note (Addendum)
CONSULTATION NOTE   Patient Name: Charles Dawson Date of Encounter: 11/23/2019 Cardiologist: No primary care provider on file.  Chief Complaint   COVID-19 positive  Impression   1. Acute anterior STEMI (V3-V5 STE) 2. COVID-19 PNA with sepsis- intubated H/o COPD/Emphysema, tobacco use dx H/o ETOH use dx Lactic acidosis sec to COVID-19 PNA/sepsis Recommendation   - currently patient is intubated and sedated for acute hypoxic respiratory failure sec to COVID-19 PNA - discussed the case with Dr Clifton James - interventional cardiology. Will take him to the cath lab  - full dose aspirin and bolus of 4,300 heparin IV given  - IV heparin drip ordered - keep the patient NPO (NG tube is in)  - recent labs pending  - ECHO in am - further orders to follow - rest of the management per Critical care team.  Patient Profile   Charles Dawson is a 64y/o male with PMH COPD/emphysema, tobacco use, etoh use disorder with c/o SOB and found to be covid-19 positive  HPI   Charles Dawson is a 64 y.o. male who is being seen today for the evaluation of CODE STEMI at the request Mauri Brooklyn, MD  Charles Dawson is a 64y/o male with PMH COPD/emphysema, tobacco use, etoh use disorder with c/o SOB and found to be covid-19 positive.  He is currently admitted in the MICU and intubated He presented yesterday with c/o acute SOB and came to the ER. Soon after admission, he was started on decardon, remdesivir, barcitinib and 15lts non rebreather. His condition got worse with agitation, respiratory acidosis leading to a rapid response and intubation. Soon after intubation and coming to MICU, ekg was done which showed STE in anterior leads V3-V5. Repeat EKG also shows STE in V3-V5. Unclear if patient had any chest pain  His work up shows WBC 19, hb 15.2, Cr 0.66 LA 2.2 ABG shows Ph 7.15/pco2 84/po2 148, bicrab 29  EKG:4:28- STE in V3-V5, no reciprocal changes (HR 127) EKG  5:14 HR 121, STE in V3-V5, no  reciprocal changes  Pending trop  ECG    - Personally Reviewed  Telemetry    - Personally Reviewed  Radiology   DG Chest 1 View  Result Date: 2019-11-23 CLINICAL DATA:  Dyspnea EXAM: CHEST  1 VIEW COMPARISON:  11/05/2019 FINDINGS: Unchanged appearance of the lungs with suspected pulmonary edema. No pleural effusion or pneumothorax. Cardiomediastinal contours are normal. IMPRESSION: Unchanged appearance of the lungs with suspected pulmonary edema. Electronically Signed   By: Deatra Robinson M.D.   On: November 23, 2019 04:14   CT ANGIO CHEST PE W OR WO CONTRAST  Result Date: 11/12/2019 CLINICAL DATA:  Shortness of breath, positive D-dimer EXAM: CT ANGIOGRAPHY CHEST WITH CONTRAST TECHNIQUE: Multidetector CT imaging of the chest was performed using the standard protocol during bolus administration of intravenous contrast. Multiplanar CT image reconstructions and MIPs were obtained to evaluate the vascular anatomy. CONTRAST:  OMNIPAQUE IOHEXOL 350 MG/ML SOLN COMPARISON:  Chest radiograph dated 11/03/2019 FINDINGS: Cardiovascular: Satisfactory opacification of the bilateral pulmonary arteries to the lobar level. Evaluation is constrained by respiratory motion. No evidence of pulmonary embolism. Although not tailored for evaluation of the thoracic aorta, there is no evidence of thoracic aortic aneurysm or dissection. Mild atherosclerotic calcifications aortic root. The heart is normal in size.  No pericardial effusion. Mild coronary atherosclerosis of the LAD and right coronary artery. Mediastinum/Nodes: Small mediastinal lymph nodes which do not meet pathologic CT size criteria. Visualized thyroid is unremarkable. Lungs/Pleura: Multifocal/diffuse ground-glass  opacities in the lungs bilaterally, with relative sparing of the lung apices (right greater than left), and a slight subpleural/peripheral predominance in the upper lobes. No associated interlobular septal thickening. No associated pleural  effusions. This appearance favors multifocal pneumonia on the basis of atypical/viral infection such as COVID rather than interstitial edema. Although evaluation is constrained by motion degradation, there are no suspicious pulmonary nodules. Mild centrilobular and paraseptal emphysematous changes at the lung apices. No pneumothorax. Upper Abdomen: Visualized upper abdomen is grossly unremarkable. Musculoskeletal: Degenerative changes of the visualized thoracolumbar spine. Review of the MIP images confirms the above findings. IMPRESSION: No evidence of pulmonary embolism. Multifocal pneumonia, suspicious for COVID. Aortic Atherosclerosis (ICD10-I70.0) and Emphysema (ICD10-J43.9). Electronically Signed   By: Charline Bills M.D.   On: 11/12/2019 04:54   Portable Chest x-ray  Result Date: 11/01/2019 CLINICAL DATA:  Endotracheal tube present EXAM: PORTABLE CHEST 1 VIEW COMPARISON:  11/17/2019 FINDINGS: Endotracheal tube tip is just below the level of the clavicular heads. Enteric tube side port is just past the gastroesophageal junction. Moderate interstitial pulmonary edema is unchanged. IMPRESSION: 1. Endotracheal tube tip just below the level of the clavicular heads. 2. Enteric tube side port just past the gastroesophageal junction. Recommend advancing by 5 cm. Electronically Signed   By: Deatra Robinson M.D.   On: 10/30/2019 05:15   DG Chest Port 1 View  Result Date: 11/25/2019 CLINICAL DATA:  Shortness of breath. EXAM: PORTABLE CHEST 1 VIEW COMPARISON:  01/26/2012 FINDINGS: Heart size remains within normal limits. Pleural-parenchymal scarring again seen in the left lung base. Diffuse bilateral pulmonary airspace disease is new since previous study. No definite evidence of pleural effusion. IMPRESSION: New diffuse bilateral pulmonary airspace disease suspicious for diffuse pulmonary edema, with infection considered less likely. Electronically Signed   By: Danae Orleans M.D.   On: 10/31/2019 11:03     Cardiac Studies     PMHx   Past Medical History:  Diagnosis Date  . Anxiety   . Empyema (HCC) 12/27/2011    Past Surgical History:  Procedure Laterality Date  . DENTAL SURGERY    . VIDEO ASSISTED THORACOSCOPY (VATS)/EMPYEMA  12/27/2011   Procedure: VIDEO ASSISTED THORACOSCOPY (VATS)/EMPYEMA;  Surgeon: Purcell Nails, MD;  Location: [facility] OR;  Service: Thoracic;  Laterality: Left;  Marland Kitchen VIDEO BRONCHOSCOPY  12/27/2011   Procedure: VIDEO BRONCHOSCOPY;  Surgeon: Purcell Nails, MD;  Location: [facility] OR;  Service: Thoracic;  Laterality: N/A;    FAMHx   No family history on file.  SOCHx    reports that he has quit smoking. He does not have any smokeless tobacco history on file. He reports current alcohol use of about 84.0 standard drinks of alcohol per week. He reports current drug use.  Outpatient Medications   No current facility-administered medications on file prior to encounter.   Current Outpatient Medications on File Prior to Encounter  Medication Sig Dispense Refill  . Aspirin-Acetaminophen-Caffeine (GOODY HEADACHE PO) Take 1 packet by mouth as needed (headache.).    Marland Kitchen ALPRAZolam (XANAX) 0.5 MG tablet Take 0.5 mg by mouth 3 (three) times daily. (Patient not taking: Reported on 11/13/2019)    . aspirin 81 MG chewable tablet Chew 324 mg by mouth once. (Patient not taking: Reported on 11/16/2019)    . oxyCODONE-acetaminophen (PERCOCET) 7.5-325 MG per tablet Take 1 tablet by mouth every 8 (eight) hours as needed for pain. (Patient not taking: Reported on 11/10/2019) 30 tablet 0    Inpatient Medications    Scheduled Meds: .  baricitinib  4 mg Oral Daily  . Chlorhexidine Gluconate Cloth  6 each Topical Daily  . dexamethasone  6 mg Oral Q24H  . enoxaparin (LOVENOX) injection  40 mg Subcutaneous Q24H  . feeding supplement  237 mL Oral BID BM  . folic acid  1 mg Oral Daily  . Ipratropium-Albuterol  1 puff Inhalation Q6H  . LORazepam      . multivitamin with minerals  1  tablet Oral Daily  . thiamine  100 mg Oral Daily   Or  . thiamine  100 mg Intravenous Daily    Continuous Infusions: . famotidine (PEPCID) IV    . propofol (DIPRIVAN) infusion 5 mcg/kg/min (11/24/2019 0434)  . remdesivir 100 mg in NS 100 mL 100 mg (11/12/19 0927)    PRN Meds: acetaminophen, guaiFENesin-dextromethorphan, LORazepam **OR** LORazepam, ondansetron **OR** ondansetron (ZOFRAN) IV, polyethylene glycol, sodium chloride   ALLERGIES   No Known Allergies  ROS   ROS- covid-19 positive and intubated  Vitals   Vitals:   11/08/2019 0517 11/18/2019 0526 11/03/2019 0530 11/07/2019 0545  BP:   122/84 98/68  Pulse:   (!) 117 (!) 112  Resp:   20 (!) 30  Temp:  97.9 F (36.6 C)  99 F (37.2 C)  TempSrc:  Axillary  Oral  SpO2: 97%  97% 93%  Weight:    76.4 kg  Height:     (1.727 m)    Intake/Output Summary (Last 24 hours) at 11/18/2019 0559 Last data filed at 11/12/2019 1457 Gross per 24 hour  Intake 240 ml  Output 501 ml  Net -261 ml   Filed Weights   11-18-2019 1544 11/10/2019 0545  Weight: 73.2 kg 76.4 kg    Physical Exam   Physical exam deferred as the patient is covid-19 positive and intubated  Labs   Results for orders placed or performed during the hospital encounter of 2019/11/18 (from the past 48 hour(s))  Lactic acid, plasma     Status: Abnormal   Collection Time: November 18, 2019 10:34 AM  Result Value Ref Range   Lactic Acid, Venous 2.4 (HH) 0.5 - 1.9 mmol/L    Comment: CRITICAL RESULT CALLED TO, READ BACK BY AND VERIFIED WITH: EMILEE SHAFER,RN AT 1212 2019-11-18 BY ZBEECH. Performed at [facility], [address] [place], [place] [address]   CBC WITH DIFFERENTIAL     Status: Abnormal   Collection Time: 11-18-19 10:34 AM  Result Value Ref Range   WBC 12.6 (H) 4.0 - 10.5 K/uL   RBC 4.35 4.22 - 5.81 MIL/uL   Hemoglobin 13.9 13.0 - 17.0 g/dL   HCT 60.4 39 - 52 %   MCV 93.1 80.0 - 100.0 fL   MCH 32.0 26.0 - 34.0 pg   MCHC 34.3 30.0 - 36.0 g/dL   RDW  54.0 98.1 - 19.1 %   Platelets 224 150 - 400 K/uL   nRBC 0.0 0.0 - 0.2 %   Neutrophils Relative % 99 %   Neutro Abs 12.5 (H) 1.7 - 7.7 K/uL   Lymphocytes Relative 0 %   Lymphs Abs 0.0 (L) 0.7 - 4.0 K/uL   Monocytes Relative 1 %   Monocytes Absolute 0.1 0.1 - 1.0 K/uL   Eosinophils Relative 0 %   Eosinophils Absolute 0.0 0.0 - 0.5 K/uL   Basophils Relative 0 %   Basophils Absolute 0.0 0.0 - 0.1 K/uL   nRBC 0 0 /100 WBC   Abs Immature Granulocytes 0.00 0.00 - 0.07 K/uL  Comment: Performed at [facility], [address] [place], [place] [address]  Comprehensive metabolic panel     Status: Abnormal   Collection Time: 11-16-2019 10:34 AM  Result Value Ref Range   Sodium 130 (L) 135 - 145 mmol/L   Potassium 3.7 3.5 - 5.1 mmol/L   Chloride 93 (L) 98 - 111 mmol/L   CO2 26 22 - 32 mmol/L   Glucose, Bld 111 (H) 70 - 99 mg/dL    Comment: Glucose reference range applies only to samples taken after fasting for at least 8 hours.   BUN 24 (H) 8 - 23 mg/dL   Creatinine, Ser 6.30 0.61 - 1.24 mg/dL   Calcium 8.0 (L) 8.9 - 10.3 mg/dL   Total Protein 5.5 (L) 6.5 - 8.1 g/dL   Albumin 2.2 (L) 3.5 - 5.0 g/dL   AST 76 (H) 15 - 41 U/L   ALT 32 0 - 44 U/L   Alkaline Phosphatase 70 38 - 126 U/L   Total Bilirubin 0.7 0.3 - 1.2 mg/dL   GFR, Estimated >16 >01 mL/min   Anion gap 11 5 - 15    Comment: Performed at [facility], [address] [place], [place] [address]  D-dimer, quantitative     Status: Abnormal   Collection Time: 16-Nov-2019 10:34 AM  Result Value Ref Range   D-Dimer, Quant >20.00 (H) 0.00 - 0.50 ug/mL-FEU    Comment: (NOTE) At the manufacturer cut-off value of 0.5 g/mL FEU, this assay has a negative predictive value of 95-100%.This assay is intended for use in conjunction with a clinical pretest probability (PTP) assessment model to exclude pulmonary embolism (PE) and deep venous thrombosis (DVT) in outpatients suspected of PE or DVT. Results should be correlated with  clinical presentation. Performed at [facility], [address] [place], [place] [address]   Procalcitonin     Status: None   Collection Time: Nov 16, 2019 10:34 AM  Result Value Ref Range   Procalcitonin 0.56 ng/mL    Comment:        Interpretation: PCT > 0.5 ng/mL and <= 2 ng/mL: Systemic infection (sepsis) is possible, but other conditions are known to elevate PCT as well. (NOTE)       Sepsis PCT Algorithm           Lower Respiratory Tract                                      Infection PCT Algorithm    ----------------------------     ----------------------------         PCT < 0.25 ng/mL                PCT < 0.10 ng/mL          Strongly encourage             Strongly discourage   discontinuation of antibiotics    initiation of antibiotics    ----------------------------     -----------------------------       PCT 0.25 - 0.50 ng/mL            PCT 0.10 - 0.25 ng/mL               OR       >80% decrease in PCT            Discourage initiation of  antibiotics      Encourage discontinuation           of antibiotics    ----------------------------     -----------------------------         PCT >= 0.50 ng/mL              PCT 0.26 - 0.50 ng/mL                AND       <80% decrease in PCT             Encourage initiation of                                             antibiotics       Encourage continuation           of antibiotics    ----------------------------     -----------------------------        PCT >= 0.50 ng/mL                  PCT > 0.50 ng/mL               AND         increase in PCT                  Strongly encourage                                      initiation of antibiotics    Strongly encourage escalation           of antibiotics                                     -----------------------------                                           PCT <= 0.25 ng/mL                                                 OR                                         > 80% decrease in PCT                                      Discontinue / Do not initiate                                             antibiotics  Performed at [facility], [address], [place], [place] [address]   Lactate dehydrogenase     Status: Abnormal   Collection  Time: 11/25/2019 10:34 AM  Result Value Ref Range   LDH 671 (H) 98 - 192 U/L    Comment: Performed at [facility], [address] [place], [place] [address]  Ferritin     Status: Abnormal   Collection Time: 11/27/2019 10:34 AM  Result Value Ref Range   Ferritin 2,896 (H) 24 - 336 ng/mL    Comment: Performed at [facility], [address] [place], [place] [address]  Triglycerides     Status: Abnormal   Collection Time: 11/07/2019 10:34 AM  Result Value Ref Range   Triglycerides 185 (H) <150 mg/dL    Comment: Performed at [facility], [address] [place], [place] [address]  Fibrinogen     Status: Abnormal   Collection Time: 11/25/2019 10:34 AM  Result Value Ref Range   Fibrinogen 556 (H) 210 - 475 mg/dL    Comment: Performed at [facility], [address] [place], [place] [address]  C-reactive protein     Status: Abnormal   Collection Time: 11/18/2019 10:34 AM  Result Value Ref Range   CRP 22.3 (H) <1.0 mg/dL    Comment: Performed at [facility], [address] [place], [place] [address]  Troponin I (High Sensitivity)     Status: Abnormal   Collection Time: 11/16/2019 10:34 AM  Result Value Ref Range   Troponin I (High Sensitivity) 33 (H) <18 ng/L    Comment: (NOTE) Elevated high sensitivity troponin I (hsTnI) values and significant  changes across serial measurements may suggest ACS but many other  chronic and acute conditions are known to elevate hsTnI results.  Refer to the "Links" section for chest pain algorithms and additional  guidance. Performed at [facility], [address] [place], [place] [address]   Brain natriuretic  peptide     Status: None   Collection Time: 10/29/2019 10:34 AM  Result Value Ref Range   B Natriuretic Peptide 84.8 0.0 - 100.0 pg/mL    Comment: Performed at [facility], [address] [place], [place] [address]  Blood Culture (routine x 2)     Status: None (Preliminary result)   Collection Time: 11/25/2019 10:43 AM   Specimen: BLOOD  Result Value Ref Range   Specimen Description BLOOD LEFT ANTECUBITAL    Special Requests      BOTTLES DRAWN AEROBIC AND ANAEROBIC Blood Culture adequate volume   Culture      NO GROWTH < 24 HOURS Performed at [facility], [address] [place], [place] [address]    Report Status PENDING   Blood Culture (routine x 2)     Status: None (Preliminary result)   Collection Time: 11/02/2019 10:46 AM   Specimen: BLOOD  Result Value Ref Range   Specimen Description BLOOD RIGHT ANTECUBITAL    Special Requests      BOTTLES DRAWN AEROBIC AND ANAEROBIC Blood Culture adequate volume   Culture  Setup Time      GRAM POSITIVE COCCI IN CLUSTERS AEROBIC BOTTLE ONLY CRITICAL RESULT CALLED TO, READ BACK BY AND VERIFIED WITH: PHARMD K PIERCE [id number] AT 1203 BY CM Performed at [facility], [address] [place], [place] [address]    Culture GRAM POSITIVE COCCI    Report Status PENDING   Blood Culture ID Panel (Reflexed)     Status: Abnormal   Collection Time: 11/04/2019 10:46 AM  Result Value Ref Range   Enterococcus faecalis  NOT DETECTED NOT DETECTED   Enterococcus Faecium NOT DETECTED NOT DETECTED   Listeria monocytogenes NOT DETECTED NOT DETECTED   Staphylococcus species DETECTED (A) NOT DETECTED    Comment: CRITICAL RESULT CALLED TO, READ BACK BY AND VERIFIED WITH: PHARMD K PIERCE [id number] AT 1203 BY CM    Staphylococcus aureus (BCID) NOT DETECTED NOT DETECTED   Staphylococcus epidermidis DETECTED (A) NOT DETECTED    Comment: CRITICAL RESULT CALLED TO, READ BACK BY AND VERIFIED WITH: PHARMD K PIERCE [id number] AT 1203 BY CM    Staphylococcus  lugdunensis NOT DETECTED NOT DETECTED   Streptococcus species NOT DETECTED NOT DETECTED   Streptococcus agalactiae NOT DETECTED NOT DETECTED   Streptococcus pneumoniae NOT DETECTED NOT DETECTED   Streptococcus pyogenes NOT DETECTED NOT DETECTED   A.calcoaceticus-baumannii NOT DETECTED NOT DETECTED   Bacteroides fragilis NOT DETECTED NOT DETECTED   Enterobacterales NOT DETECTED NOT DETECTED   Enterobacter cloacae complex NOT DETECTED NOT DETECTED   Escherichia coli NOT DETECTED NOT DETECTED   Klebsiella aerogenes NOT DETECTED NOT DETECTED   Klebsiella oxytoca NOT DETECTED NOT DETECTED   Klebsiella pneumoniae NOT DETECTED NOT DETECTED   Proteus species NOT DETECTED NOT DETECTED   Salmonella species NOT DETECTED NOT DETECTED   Serratia marcescens NOT DETECTED NOT DETECTED   Haemophilus influenzae NOT DETECTED NOT DETECTED   Neisseria meningitidis NOT DETECTED NOT DETECTED   Pseudomonas aeruginosa NOT DETECTED NOT DETECTED   Stenotrophomonas maltophilia NOT DETECTED NOT DETECTED   Candida albicans NOT DETECTED NOT DETECTED   Candida auris NOT DETECTED NOT DETECTED   Candida glabrata NOT DETECTED NOT DETECTED   Candida krusei NOT DETECTED NOT DETECTED   Candida parapsilosis NOT DETECTED NOT DETECTED   Candida tropicalis NOT DETECTED NOT DETECTED   Cryptococcus neoformans/gattii NOT DETECTED NOT DETECTED   Methicillin resistance mecA/C NOT DETECTED NOT DETECTED    Comment: Performed at [facility], [address] [place], [place] [address]  Respiratory Panel by RT PCR (Flu A&B, Covid) - Nasopharyngeal Swab     Status: Abnormal   Collection Time: 12-03-19 10:47 AM   Specimen: Nasopharyngeal Swab  Result Value Ref Range   SARS Coronavirus 2 by RT PCR POSITIVE (A) NEGATIVE    Comment: emailed L. Berdik RN 12:45 12/03/2019 ([email or web address]) (NOTE) SARS-CoV-2 target nucleic acids are DETECTED.  SARS-CoV-2 RNA is generally detectable in upper respiratory specimens  during the acute phase of  infection. Positive results are indicative of the presence of the identified virus, but do not rule out bacterial infection or co-infection with other pathogens not detected by the test. Clinical correlation with patient history and other diagnostic information is necessary to determine patient infection status. The expected result is Negative.  Fact Sheet for Patients:  https://www.moore.com/  Fact Sheet for Healthcare Providers: https://www.young.biz/  This test is not yet approved or cleared by the [place] FDA and  has been authorized for detection and/or diagnosis of SARS-CoV-2 by FDA under an Emergency Use Authorization (EUA).  This EUA will remain in effect (meaning this test can be used) for the duration of  the COVID-19  declaration under Section 564(b)(1) of the Act, 21 U.S.C. section 360bbb-3(b)(1), unless the authorization is terminated or revoked sooner.      Influenza A by PCR NEGATIVE NEGATIVE   Influenza B by PCR NEGATIVE NEGATIVE    Comment: (NOTE) The Xpert Xpress SARS-CoV-2/FLU/RSV assay is intended as an aid in  the diagnosis of influenza from Nasopharyngeal swab specimens and  should not be  used as a sole basis for treatment. Nasal washings and  aspirates are unacceptable for Xpert Xpress SARS-CoV-2/FLU/RSV  testing.  Fact Sheet for Patients: https://www.moore.com/  Fact Sheet for Healthcare Providers: https://www.young.biz/  This test is not yet approved or cleared by the [place] FDA and  has been authorized for detection and/or diagnosis of SARS-CoV-2 by  FDA under an Emergency Use Authorization (EUA). This EUA will remain  in effect (meaning this test can be used) for the duration of the  Covid-19 declaration under Section 564(b)(1) of the Act, 21  U.S.C. section 360bbb-3(b)(1), unless the authorization is  terminated or revoked. Performed at [facility], [address] [place], [place] [address]   Troponin I (High Sensitivity)     Status: Abnormal   Collection Time: 11/07/2019  1:28 PM  Result Value Ref Range   Troponin I (High Sensitivity) 24 (H) <18 ng/L    Comment: (NOTE) Elevated high sensitivity troponin I (hsTnI) values and significant  changes across serial measurements may suggest ACS but many other  chronic and acute conditions are known to elevate hsTnI results.  Refer to the "Links" section for chest pain algorithms and additional  guidance. Performed at [facility], [address] [place], [place] [address]   Lactic acid, plasma     Status: Abnormal   Collection Time: 11/08/2019  4:55 PM  Result Value Ref Range   Lactic Acid, Venous 2.8 (HH) 0.5 - 1.9 mmol/L    Comment: CRITICAL VALUE NOTED.  VALUE IS CONSISTENT WITH PREVIOUSLY REPORTED AND CALLED VALUE. Performed at [facility], [address] [place], [place] [address]   HIV Antibody (routine testing w rflx)     Status: None   Collection Time: 11/27/2019  4:55 PM  Result Value Ref Range   HIV Screen 4th Generation wRfx Non Reactive Non Reactive    Comment: Performed at [facility], [address] [place], [place] [address]  CBC     Status: None   Collection Time: 11/17/2019  4:55 PM  Result Value Ref Range   WBC 10.3 4.0 - 10.5 K/uL   RBC 4.38 4.22 - 5.81 MIL/uL   Hemoglobin 13.8 13.0 - 17.0 g/dL   HCT 73.4 39 - 52 %   MCV 92.7 80.0 - 100.0 fL   MCH 31.5 26.0 - 34.0 pg   MCHC 34.0 30.0 - 36.0 g/dL   RDW 19.3 79.0 - 24.0 %   Platelets 214 150 - 400 K/uL   nRBC 0.0 0.0 - 0.2 %    Comment: Performed at [facility], [address] [place], [place] [address]  Creatinine, serum     Status: None   Collection Time: 11/06/2019  4:55 PM  Result Value Ref Range   Creatinine, Ser 0.76 0.61 - 1.24 mg/dL   GFR, Estimated >29 >92 mL/min    Comment: Performed at [facility], [address], [place], [place] [address]  Lactic acid, plasma      Status: Abnormal   Collection Time: 11/23/2019  8:44 PM  Result Value Ref Range   Lactic Acid, Venous 3.4 (HH) 0.5 - 1.9 mmol/L    Comment: CRITICAL VALUE NOTED.  VALUE IS CONSISTENT WITH PREVIOUSLY REPORTED AND CALLED VALUE. Performed at [facility], [address] [place], [place] [address]   CBC with Differential/Platelet     Status: Abnormal   Collection Time: 11/12/19  1:34 AM  Result Value Ref  Range   WBC 7.2 4.0 - 10.5 K/uL   RBC 3.70 (L) 4.22 - 5.81 MIL/uL   Hemoglobin 11.9 (L) 13.0 - 17.0 g/dL   HCT 69.6 (L) 39 - 52 %   MCV 93.5 80.0 - 100.0 fL   MCH 32.2 26.0 - 34.0 pg   MCHC 34.4 30.0 - 36.0 g/dL   RDW 29.5 28.4 - 13.2 %   Platelets 227 150 - 400 K/uL   nRBC 0.0 0.0 - 0.2 %   Neutrophils Relative % 80 %   Neutro Abs 5.5 1.7 - 7.7 K/uL   Lymphocytes Relative 18 %   Lymphs Abs 1.3 0.7 - 4.0 K/uL   Monocytes Relative 1 %   Monocytes Absolute 0.1 0.1 - 1.0 K/uL   Eosinophils Relative 0 %   Eosinophils Absolute 0.0 0.0 - 0.5 K/uL   Basophils Relative 0 %   Basophils Absolute 0.0 0.0 - 0.1 K/uL   Immature Granulocytes 1 %   Abs Immature Granulocytes 0.06 0.00 - 0.07 K/uL    Comment: Performed at [facility], [address], [place], [place] [address]  Comprehensive metabolic panel     Status: Abnormal   Collection Time: 11/12/19  1:34 AM  Result Value Ref Range   Sodium 133 (L) 135 - 145 mmol/L   Potassium 3.8 3.5 - 5.1 mmol/L   Chloride 99 98 - 111 mmol/L   CO2 24 22 - 32 mmol/L   Glucose, Bld 131 (H) 70 - 99 mg/dL    Comment: Glucose reference range applies only to samples taken after fasting for at least 8 hours.   BUN 20 8 - 23 mg/dL   Creatinine, Ser 2.72 0.61 - 1.24 mg/dL   Calcium 7.8 (L) 8.9 - 10.3 mg/dL   Total Protein 5.2 (L) 6.5 - 8.1 g/dL   Albumin 2.0 (L) 3.5 - 5.0 g/dL   AST 61 (H) 15 - 41 U/L   ALT 27 0 - 44 U/L   Alkaline Phosphatase 57 38 - 126 U/L   Total Bilirubin 0.6 0.3 - 1.2 mg/dL   GFR, Estimated >53 >66 mL/min   Anion gap  10 5 - 15    Comment: Performed at [facility], [address] [place], [place] [address]  C-reactive protein     Status: Abnormal   Collection Time: 11/12/19  1:34 AM  Result Value Ref Range   CRP 19.9 (H) <1.0 mg/dL    Comment: Performed at [facility], [address] [place], [place] [address]  D-dimer, quantitative (not at [facility])     Status: Abnormal   Collection Time: 11/12/19  1:34 AM  Result Value Ref Range   D-Dimer, Quant >20.00 (H) 0.00 - 0.50 ug/mL-FEU    Comment: (NOTE) At the manufacturer cut-off value of 0.5 g/mL FEU, this assay has a negative predictive value of 95-100%.This assay is intended for use in conjunction with a clinical pretest probability (PTP) assessment model to exclude pulmonary embolism (PE) and deep venous thrombosis (DVT) in outpatients suspected of PE or DVT. Results should be correlated with clinical presentation. Performed at [facility], [address] [place], [place] [address]   Ferritin     Status: Abnormal   Collection Time: 11/12/19  1:34 AM  Result Value Ref Range   Ferritin 2,846 (H) 24 - 336 ng/mL    Comment: Performed at [facility], [address], [place], [place] [address]  Magnesium  Status: None   Collection Time: 11/12/19  1:34 AM  Result Value Ref Range   Magnesium 2.1 1.7 - 2.4 mg/dL    Comment: Performed at [facility], [address], [place], [place] [address]  Phosphorus     Status: None   Collection Time: 11/12/19  1:34 AM  Result Value Ref Range   Phosphorus 3.1 2.5 - 4.6 mg/dL    Comment: Performed at [facility], [address], [place], [place] [address]  Lactic acid, plasma     Status: Abnormal   Collection Time: 11/12/19  1:34 AM  Result Value Ref Range   Lactic Acid, Venous 2.4 (HH) 0.5 - 1.9 mmol/L    Comment: CRITICAL VALUE NOTED.  VALUE IS CONSISTENT WITH PREVIOUSLY REPORTED AND CALLED VALUE. Performed at [facility], [address] [place], [place]  [address]   Lactic acid, plasma     Status: Abnormal   Collection Time: 11/12/19  4:55 AM  Result Value Ref Range   Lactic Acid, Venous 2.2 (HH) 0.5 - 1.9 mmol/L    Comment: CRITICAL VALUE NOTED.  VALUE IS CONSISTENT WITH PREVIOUSLY REPORTED AND CALLED VALUE. Performed at [facility], [address], [place], [place] [address]   Triglycerides     Status: Abnormal   Collection Time: 2019-12-04  4:27 AM  Result Value Ref Range   Triglycerides 166 (H) <150 mg/dL    Comment: Performed at [facility], [address], [place], [place] [address]  CBC with Differential/Platelet     Status: Abnormal   Collection Time: Dec 04, 2019  5:00 AM  Result Value Ref Range   WBC 19.2 (H) 4.0 - 10.5 K/uL   RBC 4.83 4.22 - 5.81 MIL/uL   Hemoglobin 15.2 13.0 - 17.0 g/dL    Comment: REPEATED TO VERIFY   HCT 45.5 39 - 52 %   MCV 94.2 80.0 - 100.0 fL   MCH 31.5 26.0 - 34.0 pg   MCHC 33.4 30.0 - 36.0 g/dL   RDW 13.0 86.5 - 78.4 %   Platelets 204 150 - 400 K/uL   nRBC 0.0 0.0 - 0.2 %   Neutrophils Relative % 85 %   Neutro Abs 16.4 (H) 1.7 - 7.7 K/uL   Lymphocytes Relative 11 %   Lymphs Abs 2.1 0.7 - 4.0 K/uL   Monocytes Relative 3 %   Monocytes Absolute 0.5 0.1 - 1.0 K/uL   Eosinophils Relative 0 %   Eosinophils Absolute 0.0 0.0 - 0.5 K/uL   Basophils Relative 0 %   Basophils Absolute 0.0 0.0 - 0.1 K/uL   RBC Morphology MORPHOLOGY UNREMARKABLE    Immature Granulocytes 1 %   Abs Immature Granulocytes 0.13 (H) 0.00 - 0.07 K/uL    Comment: Performed at [facility], [address], [place], [place] [address]  Comprehensive metabolic panel     Status: Abnormal   Collection Time: 12/04/19  5:00 AM  Result Value Ref Range   Sodium 137 135 - 145 mmol/L   Potassium 3.5 3.5 - 5.1 mmol/L   Chloride 101 98 - 111 mmol/L   CO2 26 22 - 32 mmol/L   Glucose, Bld 132 (H) 70 - 99 mg/dL    Comment: Glucose reference range applies only to samples taken after fasting for at least 8 hours.   BUN 20 8 -  23 mg/dL   Creatinine, Ser 5.28 0.61 - 1.24 mg/dL   Calcium 7.9 (L) 8.9 -  10.3 mg/dL   Total Protein 5.6 (L) 6.5 - 8.1 g/dL   Albumin 2.2 (L) 3.5 - 5.0 g/dL   AST 77 (H) 15 - 41 U/L   ALT 34 0 - 44 U/L   Alkaline Phosphatase 107 38 - 126 U/L   Total Bilirubin 0.9 0.3 - 1.2 mg/dL   GFR, Estimated >40 >98 mL/min   Anion gap 10 5 - 15    Comment: Performed at [facility], [address], [place], [place] [address]  Magnesium     Status: None   Collection Time: 11-25-19  5:00 AM  Result Value Ref Range   Magnesium 2.1 1.7 - 2.4 mg/dL    Comment: Performed at [facility], [address], [place], [place] [address]  Phosphorus     Status: None   Collection Time: 11/25/2019  5:00 AM  Result Value Ref Range   Phosphorus 3.9 2.5 - 4.6 mg/dL    Comment: Performed at [facility], [address] [place], [place] [address]  I-STAT 7, (LYTES, BLD GAS, ICA, H+H)     Status: Abnormal   Collection Time: 11/25/19  5:16 AM  Result Value Ref Range   pH, Arterial 7.154 (LL) 7.35 - 7.45   pCO2 arterial 84.3 (HH) 32 - 48 mmHg   pO2, Arterial 148 (H) 83 - 108 mmHg   Bicarbonate 29.7 (H) 20.0 - 28.0 mmol/L   TCO2 32 22 - 32 mmol/L   O2 Saturation 98.0 %   Acid-base deficit 2.0 0.0 - 2.0 mmol/L   Sodium 140 135 - 145 mmol/L   Potassium 3.5 3.5 - 5.1 mmol/L   Calcium, Ion 1.22 1.15 - 1.40 mmol/L   HCT 46.0 39 - 52 %   Hemoglobin 15.6 13.0 - 17.0 g/dL   Sample type ARTERIAL    Comment NOTIFIED PHYSICIAN   Glucose, capillary     Status: Abnormal   Collection Time: 11/25/2019  5:24 AM  Result Value Ref Range   Glucose-Capillary 130 (H) 70 - 99 mg/dL    Comment: Glucose reference range applies only to samples taken after fasting for at least 8 hours.        Length of Stay:  LOS: 2 days      Elmon Kirschner Nov 25, 2019, 5:59 AM

## 2019-11-13 NOTE — Consult Note (Signed)
NAME:  Charles Dawson, MRN:  [id number], DOB:  12/27/1955, LOS: 2 ADMISSION DATE:  12-03-2019, CONSULTATION DATE: 11/10/2019 REFERRING MD: Internal medicine residency service, CHIEF COMPLAINT: Patient with COVID-19  Brief History   Patient is a 64 year old admitted yesterday for complaints of shortness of breath hypoxemia found to be Covid positive with COVID pneumonia by chest x-ray and clinical exam.  History of present illness   Patient is a 64 year old admitted yesterday for complaints of shortness of breath hypoxemia found to be Covid positive with COVID pneumonia by chest x-ray and clinical exam.  Patient was started on remdesivir and Decadron. I was asked to see the patient urgently due to desaturation.  The patient had earlier in the evening become combative.  He has a questionable history of heavy alcohol abuse.  He drinks about 4 cases of beer a week.  The general feeling at that time was that he was in alcohol withdrawal with worsening hypoxemia due to his Covid pneumonia.  On my evaluation the patient was extremely agitated pulling his oxygen off oxygen saturations in the mid 70s.  Is urgently transferred to the ICU and intubated.  During intubation his oxygen saturations remained in the 60s.  At the start of intubation his oxygen saturation was 70%.  After the ET tube was placed there was significant frothy red secretions.  Follow-up chest x-ray is still pending. Intubation the patient was placed on PEEP of 14, 100% oxygen saturation came back up into the 90s.  Past Medical History  Anxiety Empyema  Significant Hospital Events   Admission 03-Dec-2019 Transferred to the ICU with intubation 11/09/2019  Consults:  PCCM  Procedures:  Intubation  Significant Diagnostic Tests:  Covid positive  Micro Data:  Again if  Antimicrobials:  None  Interim history/subjective:  NA  Objective   Blood pressure 127/78, pulse 79, temperature 97.7 F (36.5 C), temperature source Oral,  resp. rate (!) 22, height 5\' 8"  (1.727 m), weight 73.2 kg, SpO2 90 %.        Intake/Output Summary (Last 24 hours) at 11/02/2019 0431 Last data filed at 11/12/2019 1457 Gross per 24 hour  Intake 240 ml  Output 1001 ml  Net -761 ml   Filed Weights   12/03/2019 1544  Weight: 73.2 kg    Examination: General: Thin agitated white male in acute respiratory distress HENT: N/A Lungs: Coarse wet bilaterally Cardiovascular: Tachycardic Abdomen: Benign bowel sounds positive Extremities: Within normal limits Neuro: Nonfocal agitated pulling at support devices head oxygen GU: Within normal limits  Resolved Hospital Problem list   NA  Assessment & Plan:  1.  Hypoxemia secondary to Covid pneumonia: She has been intubated.  We will continue remdesivir and Decadron.  On mechanical ventilation currently with oxygen saturations in the 90s.  Sedate with propofol  2.  Alcohol withdrawal: Sedated on ventilator with propofol  3.  Covid pneumonia: Continue remdesivir and Decadron  4.  Persistent lactic acidosis: Continue to follow  5.  Elevated D-dimer: We will check venous ultrasound, ETA at time of admission was negative for pulmonary embolus  Best practice:  Diet: N.p.o. Pain/Anxiety/Delirium protocol (if indicated): Fall VAP protocol (if indicated): Yes DVT prophylaxis: Lovenox GI prophylaxis: Pepcid Glucose control: Monitor Mobility: Bedrest Code Status: Full code Family Communication: Available Disposition: ICU for care  Labs   CBC: Recent Labs  Lab 12/03/19 1034 Dec 03, 2019 1655 11/12/19 0134  WBC 12.6* 10.3 7.2  NEUTROABS 12.5*  --  5.5  HGB 13.9 13.8 11.9*  HCT 40.5  40.6 34.6*  MCV 93.1 92.7 93.5  PLT 224 214 227    Basic Metabolic Panel: Recent Labs  Lab 11/10/2019 1034 11/03/2019 1655 11/12/19 0134  NA 130*  --  133*  K 3.7  --  3.8  CL 93*  --  99  CO2 26  --  24  GLUCOSE 111*  --  131*  BUN 24*  --  20  CREATININE 0.89 0.76 0.73  CALCIUM 8.0*  --  7.8*  MG   --   --  2.1  PHOS  --   --  3.1   GFR: Estimated Creatinine Clearance: 87.9 mL/min (by C-G formula based on SCr of 0.73 mg/dL). Recent Labs  Lab 11/16/2019 1034 11/20/2019 1034 11/01/2019 1655 10/31/2019 2044 11/12/19 0134 11/12/19 0455  PROCALCITON 0.56  --   --   --   --   --   WBC 12.6*  --  10.3  --  7.2  --   LATICACIDVEN 2.4*   < > 2.8* 3.4* 2.4* 2.2*   < > = values in this interval not displayed.    Liver Function Tests: Recent Labs  Lab 10/29/2019 1034 11/12/19 0134  AST 76* 61*  ALT 32 27  ALKPHOS 70 57  BILITOT 0.7 0.6  PROT 5.5* 5.2*  ALBUMIN 2.2* 2.0*   No results for input(s): LIPASE, AMYLASE in the last 168 hours. No results for input(s): AMMONIA in the last 168 hours.  ABG    Component Value Date/Time   PHART 7.434 12/28/2011 0526   PCO2ART 34.9 (L) 12/28/2011 0526   PO2ART 154.0 (H) 12/28/2011 0526   HCO3 23.0 12/28/2011 0526   TCO2 24.0 12/28/2011 0526   ACIDBASEDEF 0.7 12/28/2011 0526   O2SAT 99.5 12/28/2011 0526     Coagulation Profile: No results for input(s): INR, PROTIME in the last 168 hours.  Cardiac Enzymes: No results for input(s): CKTOTAL, CKMB, CKMBINDEX, TROPONINI in the last 168 hours.  HbA1C: No results found for: HGBA1C  CBG: No results for input(s): GLUCAP in the last 168 hours.  Review of Systems:   Unable to obtain  Past Medical History  He,  has a past medical history of Anxiety and Empyema (HCC) (12/27/2011).   Surgical History    Past Surgical History:  Procedure Laterality Date  . DENTAL SURGERY    . VIDEO ASSISTED THORACOSCOPY (VATS)/EMPYEMA  12/27/2011   Procedure: VIDEO ASSISTED THORACOSCOPY (VATS)/EMPYEMA;  Surgeon: Purcell Nails, MD;  Location: [facility] OR;  Service: Thoracic;  Laterality: Left;  Marland Kitchen VIDEO BRONCHOSCOPY  12/27/2011   Procedure: VIDEO BRONCHOSCOPY;  Surgeon: Purcell Nails, MD;  Location: [facility] OR;  Service: Thoracic;  Laterality: N/A;     Social History   reports that he has quit smoking. He does  not have any smokeless tobacco history on file. He reports current alcohol use of about 84.0 standard drinks of alcohol per week. He reports current drug use.   Family History   His family history is not on file.   Allergies No Known Allergies   Home Medications  Prior to Admission medications   Medication Sig Start Date End Date Taking? Authorizing Provider  Aspirin-Acetaminophen-Caffeine (GOODY HEADACHE PO) Take 1 packet by mouth as needed (headache.).   Yes [provider]  ALPRAZolam Prudy Feeler) 0.5 MG tablet Take 0.5 mg by mouth 3 (three) times daily. Patient not taking: Reported on 10/29/2019    [provider]  aspirin 81 MG chewable tablet Chew 324 mg by mouth once. Patient  not taking: Reported on 11/21/2019    [provider]  oxyCODONE-acetaminophen (PERCOCET) 7.5-325 MG per tablet Take 1 tablet by mouth every 8 (eight) hours as needed for pain. Patient not taking: Reported on 11-21-19 01/26/12   Wilmon Pali, PA-C     Critical care time: Over 45 minutes was spent bedside evaluation chart review critical care planning not including procedures

## 2019-11-13 NOTE — Progress Notes (Signed)
Patient arrived to 46M as RR from 5W. As patient rolled onto the unit - was extremely panicked and trying to take off oxygen mask and sit up. Patient was intubated emergently . After intubation patient HR was elevated and irregular. EKG performed showing STEMI. Code Stemi called and patient evaluated by cardiology. Transported to cath lab for evaluation. Discussed patient's situation with family members. Spoke with patient's nephew who states that he Comptroller) and patient's brother are only family. Password of "Billy" set up. Nephew states that he is to be contacted in emergency.   Noe Gens, RN

## 2019-11-14 ENCOUNTER — Encounter (HOSPITAL_COMMUNITY): Payer: Self-pay | Admitting: Cardiovascular Disease

## 2019-11-14 DIAGNOSIS — Z978 Presence of other specified devices: Secondary | ICD-10-CM

## 2019-11-14 DIAGNOSIS — J1282 Pneumonia due to Coronavirus disease 2019: Secondary | ICD-10-CM | POA: Diagnosis not present

## 2019-11-14 DIAGNOSIS — I2541 Coronary artery aneurysm: Secondary | ICD-10-CM

## 2019-11-14 DIAGNOSIS — U071 COVID-19: Secondary | ICD-10-CM | POA: Diagnosis not present

## 2019-11-14 LAB — COMPREHENSIVE METABOLIC PANEL
ALT: 32 U/L (ref 0–44)
AST: 75 U/L — ABNORMAL HIGH (ref 15–41)
Albumin: 1.7 g/dL — ABNORMAL LOW (ref 3.5–5.0)
Alkaline Phosphatase: 75 U/L (ref 38–126)
Anion gap: 12 (ref 5–15)
BUN: 40 mg/dL — ABNORMAL HIGH (ref 8–23)
CO2: 22 mmol/L (ref 22–32)
Calcium: 6.9 mg/dL — ABNORMAL LOW (ref 8.9–10.3)
Chloride: 107 mmol/L (ref 98–111)
Creatinine, Ser: 1.5 mg/dL — ABNORMAL HIGH (ref 0.61–1.24)
GFR, Estimated: 48 mL/min — ABNORMAL LOW (ref >60)
Glucose, Bld: 170 mg/dL — ABNORMAL HIGH (ref 70–99)
Potassium: 4.8 mmol/L (ref 3.5–5.1)
Sodium: 141 mmol/L (ref 135–145)
Total Bilirubin: 0.5 mg/dL (ref 0.3–1.2)
Total Protein: 5.3 g/dL — ABNORMAL LOW (ref 6.5–8.1)

## 2019-11-14 LAB — CBC WITH DIFFERENTIAL/PLATELET
Abs Immature Granulocytes: 0.46 10*3/uL — ABNORMAL HIGH (ref 0.00–0.07)
Basophils Absolute: 0.1 10*3/uL (ref 0.0–0.1)
Basophils Relative: 0 %
Eosinophils Absolute: 0 10*3/uL (ref 0.0–0.5)
Eosinophils Relative: 0 %
HCT: 35 % — ABNORMAL LOW (ref 39.0–52.0)
Hemoglobin: 11.2 g/dL — ABNORMAL LOW (ref 13.0–17.0)
Immature Granulocytes: 2 %
Lymphocytes Relative: 7 %
Lymphs Abs: 1.5 10*3/uL (ref 0.7–4.0)
MCH: 31.8 pg (ref 26.0–34.0)
MCHC: 32 g/dL (ref 30.0–36.0)
MCV: 99.4 fL (ref 80.0–100.0)
Monocytes Absolute: 0.4 10*3/uL (ref 0.1–1.0)
Monocytes Relative: 2 %
Neutro Abs: 18 10*3/uL — ABNORMAL HIGH (ref 1.7–7.7)
Neutrophils Relative %: 89 %
Platelets: 163 10*3/uL (ref 150–400)
RBC: 3.52 MIL/uL — ABNORMAL LOW (ref 4.22–5.81)
RDW: 15.6 % — ABNORMAL HIGH (ref 11.5–15.5)
WBC: 20.4 10*3/uL — ABNORMAL HIGH (ref 4.0–10.5)
nRBC: 0 % (ref 0.0–0.2)

## 2019-11-14 LAB — GLUCOSE, CAPILLARY
Glucose-Capillary: 122 mg/dL — ABNORMAL HIGH (ref 70–99)
Glucose-Capillary: 133 mg/dL — ABNORMAL HIGH (ref 70–99)
Glucose-Capillary: 136 mg/dL — ABNORMAL HIGH (ref 70–99)
Glucose-Capillary: 163 mg/dL — ABNORMAL HIGH (ref 70–99)
Glucose-Capillary: 163 mg/dL — ABNORMAL HIGH (ref 70–99)
Glucose-Capillary: 176 mg/dL — ABNORMAL HIGH (ref 70–99)
Glucose-Capillary: 184 mg/dL — ABNORMAL HIGH (ref 70–99)

## 2019-11-14 LAB — HEPARIN LEVEL (UNFRACTIONATED)
Heparin Unfractionated: 0.18 IU/mL — ABNORMAL LOW (ref 0.30–0.70)
Heparin Unfractionated: 0.18 IU/mL — ABNORMAL LOW (ref 0.30–0.70)
Heparin Unfractionated: 0.36 IU/mL (ref 0.30–0.70)

## 2019-11-14 LAB — POCT I-STAT 7, (LYTES, BLD GAS, ICA,H+H)
Acid-base deficit: 1 mmol/L (ref 0.0–2.0)
Bicarbonate: 25.3 mmol/L (ref 20.0–28.0)
Calcium, Ion: 1.06 mmol/L — ABNORMAL LOW (ref 1.15–1.40)
HCT: 31 % — ABNORMAL LOW (ref 39.0–52.0)
Hemoglobin: 10.5 g/dL — ABNORMAL LOW (ref 13.0–17.0)
O2 Saturation: 90 %
Patient temperature: 99.3
Potassium: 4.5 mmol/L (ref 3.5–5.1)
Sodium: 142 mmol/L (ref 135–145)
TCO2: 27 mmol/L (ref 22–32)
pCO2 arterial: 52 mmHg — ABNORMAL HIGH (ref 32.0–48.0)
pH, Arterial: 7.297 — ABNORMAL LOW (ref 7.350–7.450)
pO2, Arterial: 67 mmHg — ABNORMAL LOW (ref 83.0–108.0)

## 2019-11-14 LAB — FERRITIN: Ferritin: 2124 ng/mL — ABNORMAL HIGH (ref 24–336)

## 2019-11-14 LAB — D-DIMER, QUANTITATIVE: D-Dimer, Quant: >20 ug/mL-FEU — ABNORMAL HIGH (ref 0.00–0.50)

## 2019-11-14 LAB — MAGNESIUM: Magnesium: 2.4 mg/dL (ref 1.7–2.4)

## 2019-11-14 LAB — PHOSPHORUS: Phosphorus: 5.4 mg/dL — ABNORMAL HIGH (ref 2.5–4.6)

## 2019-11-14 LAB — C-REACTIVE PROTEIN: CRP: 23.7 mg/dL — ABNORMAL HIGH (ref <1.0)

## 2019-11-14 MED ORDER — BARICITINIB 2 MG PO TABS: 2.0000 mg | ORAL_TABLET | Freq: Every day | ORAL | Status: DC

## 2019-11-14 MED ORDER — VITAL HIGH PROTEIN PO LIQD
1000.0000 mL | ORAL | Status: DC
  Administered 2019-11-14 – 2019-11-16 (×3): 1000 mL

## 2019-11-14 MED ORDER — LORAZEPAM 2 MG/ML PO CONC: 4.0000 mg | Freq: Three times a day (TID) | ORAL | Status: DC

## 2019-11-14 MED ORDER — GUAIFENESIN-DM 100-10 MG/5ML PO SYRP: 10.0000 mL | ORAL_SOLUTION | ORAL | Status: DC | PRN

## 2019-11-14 MED ORDER — BARICITINIB 2 MG PO TABS
2.0000 mg | ORAL_TABLET | Freq: Every day | ORAL | Status: DC
  Administered 2019-11-15: 2 mg
  Filled 2019-11-14: qty 1

## 2019-11-14 MED ORDER — LORAZEPAM 2 MG/ML PO CONC
4.0000 mg | Freq: Three times a day (TID) | ORAL | Status: DC
  Filled 2019-11-14: qty 2

## 2019-11-14 MED ORDER — ACETAMINOPHEN 325 MG PO TABS
650.0000 mg | ORAL_TABLET | Freq: Four times a day (QID) | ORAL | Status: DC | PRN
  Administered 2019-11-15: 650 mg
  Filled 2019-11-14: qty 2

## 2019-11-14 MED ORDER — LORAZEPAM 1 MG PO TABS: 4.0000 mg | ORAL_TABLET | Freq: Three times a day (TID) | ORAL | Status: DC

## 2019-11-14 MED ORDER — FAMOTIDINE IN NACL 20-0.9 MG/50ML-% IV SOLN
20.0000 mg | INTRAVENOUS | Status: DC
  Administered 2019-11-15 – 2019-11-19 (×5): 20 mg via INTRAVENOUS
  Filled 2019-11-14 (×5): qty 50

## 2019-11-14 MED ORDER — LORAZEPAM 1 MG PO TABS
4.0000 mg | ORAL_TABLET | Freq: Three times a day (TID) | ORAL | Status: DC
  Administered 2019-11-14 – 2019-11-15 (×5): 4 mg
  Filled 2019-11-14 (×5): qty 4

## 2019-11-14 MED ORDER — INSULIN ASPART 100 UNIT/ML ~~LOC~~ SOLN
2.0000 [IU] | SUBCUTANEOUS | Status: DC
  Administered 2019-11-14: 2 [IU] via SUBCUTANEOUS
  Administered 2019-11-14: 4 [IU] via SUBCUTANEOUS
  Administered 2019-11-15: 2 [IU] via SUBCUTANEOUS

## 2019-11-14 MED ORDER — PROSOURCE TF PO LIQD
90.0000 mL | Freq: Three times a day (TID) | ORAL | Status: DC
  Administered 2019-11-14 – 2019-11-17 (×9): 90 mL
  Filled 2019-11-14 (×9): qty 90

## 2019-11-14 NOTE — Progress Notes (Signed)
ABG results given to Dr. Denese Killings. PF ratio is 167. Pt to remain prone at this time. Will continue to wean peep as tolerated. RT will continue to monitor.

## 2019-11-14 NOTE — Progress Notes (Signed)
2 mg of Ativan oral solution wasted into stericycle with Army Fossa RN.

## 2019-11-14 NOTE — Progress Notes (Addendum)
NAME:  Charles Dawson, MRN:  [id number], DOB:  25-Feb-1955, LOS: 3 ADMISSION DATE:  10/28/2019, CONSULTATION DATE: 11/27/19 REFERRING MD: Internal medicine residency service, CHIEF COMPLAINT: Patient with COVID-19  Brief History   Patient is a 64 year old admitted yesterday for complaints of shortness of breath hypoxemia found to be Covid positive with COVID pneumonia by chest x-ray and clinical exam.  History of present illness   Patient is a 64 year old admitted yesterday for complaints of shortness of breath hypoxemia found to be Covid positive with COVID pneumonia by chest x-ray and clinical exam.  Patient was started on remdesivir and Decadron. I was asked to see the patient urgently due to desaturation.  The patient had earlier in the evening become combative.  He has a questionable history of heavy alcohol abuse.  He drinks about 4 cases of beer a week.  The general feeling at that time was that he was in alcohol withdrawal with worsening hypoxemia due to his Covid pneumonia.  On my evaluation the patient was extremely agitated pulling his oxygen off oxygen saturations in the mid 70s.  Is urgently transferred to the ICU and intubated.  During intubation his oxygen saturations remained in the 60s.  At the start of intubation his oxygen saturation was 70%.  After the ET tube was placed there was significant frothy red secretions.  Follow-up chest x-ray is still pending. Intubation the patient was placed on PEEP of 14, 100% oxygen saturation came back up into the 90s.  Past Medical History   Past Medical History:  Diagnosis Date  . Anxiety   . 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  Improving oxygenation.  Remains  tachypneic.  Objective   Blood pressure (!) 125/57, pulse 71, temperature 99.5 F (37.5 C), resp. rate (!) 32, height 5\' 8"  (1.727 m), weight 82.1 kg, SpO2 100 %. CVP:  [5 mmHg-11 mmHg] 8 mmHg  Vent Mode: PRVC FiO2 (%):  [60 %-80 %] 60 % Set Rate:  [30 bmp] 30 bmp Vt Set:  [520 mL] 520 mL PEEP:  [12 cmH20-14 cmH20] 12 cmH20 Plateau Pressure:  [22 cmH20-29 cmH20] 24 cmH20   Intake/Output Summary (Last 24 hours) at 11/14/2019 1052 Last data filed at 11/14/2019 0600 Gross per 24 hour  Intake 6777.08 ml  Output 860 ml  Net 5917.08 ml   Filed Weights   11/25/2019 1544 Nov 27, 2019 0545 11/14/19 0500  Weight: 73.2 kg 76.4 kg 82.1 kg   CVP:  [5 mmHg-11 mmHg] 8 mmHg  Examination: General: Thin ill kempt man intubated. HENT: ET tube and OG tube in place Lungs: Clear anteriorly.  Spontaneous breathing with accessory muscle use spite mechanical ventilation Cardiovascular: Extremities warm heart sounds are unremarkable. Abdomen: Benign bowel sounds positive abdomen is soft, tolerating tube feeds Extremities: No edema Neuro: Sedated no response to painful stimuli. GU: Catheter in place, amber urine  Resolved Hospital Problem list   NA  Assessment & Plan:  Critically ill due to hypoxemia secondary to Covid pneumonia.  -Continue current lung protective ventilation, acceptable plateau pressures -Recheck ABG to evaluate need for proning. -Continue current level of sedation to ensure ventilator synchrony.  ST elevation MI-not amenable to percutaneous revascularization. -Start medical management: Aspirin, clopidogrel and rosuvastatin started. -Echocardiogram to assess LV function -Initiate beta-blocker, ACE inhibitor as blood pressure  and heart rate allow. -Surgical assessment once Covid resolves.  Multifactorial shock due to COVID-19 pneumonia, sedative use, cardiogenic shock requiring titration of norepinephrine and epinephrine. -Limit propofol use.  May consider transition to midazolam  infusion if unable to wean norepinephrine. -Wean norepinephrine to keep MAP greater than 65 -Continue epinephrine at current dose until norepinephrine dose less than 15  Possible alcohol withdrawal alcohol withdrawal, however may have been symptoms of myocardial infarction. -Initiate enteral benzodiazepine to limit propofol use. -Once off IV propofol will start benzodiazepine taper.  Acute deep vein thrombosis involving the left femoral vein, and left gastrocnemius veins.  -Continue IV heparin.  Covid pneumonia -Continue remdesivir and Decadron  Acute kidney injury -Maintain euvolemia and normotension. -Follow kidney function -Development of severe renal failure requiring renal replacement were portend a poor prognosis.  Staph epidermidis positive blood culture likely contaminant -No intervention required  Best practice:  Diet: N.p.o. - hold on starting tube feeds pending possible extubation. Pain/Anxiety/Delirium protocol (if indicated): Wean propofol use IV fentanyl. Target RASS -2 VAP protocol (if indicated): Yes DVT prophylaxis: Lovenox GI prophylaxis: Pepcid Glucose control: Monitor Mobility: Bedrest Code Status: Full code Family Communication:  Brother Roe Coombs was updated 10/18.  Confirmed that the patient was not in the best of health and continued heavy drug use.  I informed him that the prognosis was guarded and that further organ would portend a poor prognosis and that a transition to comfort care would be appropriate if he were to not improve over the next week.  Don asked that we update his brother Annette Stable who has power of attorney. Disposition: ICU  Labs   CBC: Recent Labs  Lab 11/14/2019 1034 11/05/2019 1034 11/25/2019 1655 11/14/2019 1655 11/12/19 0134 11/22/2019 0500 11/24/2019 0516 11/17/2019 1054 11/14/19 0500  WBC 12.6*  --  10.3  --  7.2 19.2*  --   --  20.4*  NEUTROABS 12.5*  --   --   --  5.5 16.4*  --   --  18.0*  HGB 13.9   < > 13.8   < > 11.9* 15.2 15.6 12.2*  11.2*  HCT 40.5   < > 40.6   < > 34.6* 45.5 46.0 36.0* 35.0*  MCV 93.1  --  92.7  --  93.5 94.2  --   --  99.4  PLT 224  --  214  --  227 204  --   --  163   < > = values in this interval not displayed.    Basic Metabolic Panel: Recent Labs  Lab 11/14/2019 1034 11/21/2019 1034 11/02/2019 1655 11/12/19 0134 11/12/2019 0500 11/12/2019 0516 11/08/2019 1054 11/14/19 0500  NA 130*   < >  --  133* 137 140 138 141  K 3.7   < >  --  3.8 3.5 3.5 3.4* 4.8  CL 93*  --   --  99 101  --   --  107  CO2 26  --   --  24 26  --   --  22  GLUCOSE 111*  --   --  131* 132*  --   --  170*  BUN 24*  --   --  20 20  --   --  40*  CREATININE 0.89  --  0.76 0.73 0.66  --   --  1.50*  CALCIUM 8.0*  --   --  7.8* 7.9*  --   --  6.9*  MG  --   --   --  2.1 2.1  --   --  2.4  PHOS  --   --   --  3.1 3.9  --   --  5.4*   < > = values in this interval not displayed.   GFR: Estimated Creatinine Clearance: 50.6 mL/min (A) (by C-G formula based on SCr of 1.5 mg/dL (H)). Recent Labs  Lab 11-12-19 1034 11-12-2019 1034 11/12/19 1655 11/12/19 2044 11/12/19 0134 11/12/19 0455 11/24/2019 0500 11/16/2019 0513 11/06/2019 1029 11/14/19 0500  PROCALCITON 0.56  --   --   --   --   --   --   --   --   --   WBC 12.6*   < > 10.3  --  7.2  --  19.2*  --   --  20.4*  LATICACIDVEN 2.4*   < > 2.8*   < > 2.4* 2.2*  --  3.2* 3.0*  --    < > = values in this interval not displayed.    Liver Function Tests: Recent Labs  Lab 11-12-2019 1034 11/12/19 0134 11/05/2019 0500 11/14/19 0500  AST 76* 61* 77* 75*  ALT 32 27 34 32  ALKPHOS 70 57 107 75  BILITOT 0.7 0.6 0.9 0.5  PROT 5.5* 5.2* 5.6* 5.3*  ALBUMIN 2.2* 2.0* 2.2* 1.7*   No results for input(s): LIPASE, AMYLASE in the last 168 hours. No results for input(s): AMMONIA in the last 168 hours.  ABG    Component Value Date/Time   PHART 7.416 11/03/2019 1054   PCO2ART 38.3 11/17/2019 1054   PO2ART 55 (L) 11/25/2019 1054   HCO3 24.6 11/16/2019 1054   TCO2 26 11/14/2019 1054    ACIDBASEDEF 2.0 10/29/2019 0516   O2SAT 84.2 11/22/2019 1338     Coagulation Profile: Recent Labs  Lab 10/28/2019 0500  INR 1.5*   HbA1C: No results found for: HGBA1C  CBG: Recent Labs  Lab 11/18/2019 1552 11/15/2019 1932 11/14/19 0004 11/14/19 0458 11/14/19 0750  GLUCAP 98 109* 122* 163* 176*   Cardiac Panel (last 3 results) Recent Labs    11-12-19 1328 11/25/2019 0500 11/09/2019 1029  TROPONINIHS 24* 572* 2,953*   CRITICAL CARE Performed by: Lynnell Catalan   Total critical care time: 40 minutes  Critical care time was exclusive of separately billable procedures and treating other patients.  Critical care was necessary to treat or prevent imminent or life-threatening deterioration.  Critical care was time spent personally by me on the following activities: development of treatment plan with patient and/or surrogate as well as nursing, discussions with consultants, evaluation of patient's response to treatment, examination of patient, obtaining history from patient or surrogate, ordering and performing treatments and interventions, ordering and review of laboratory studies, ordering and review of radiographic studies, pulse oximetry, re-evaluation of patient's condition and participation in multidisciplinary rounds.  Lynnell Catalan, MD [facility] ICU Physician [facility]  Pager: [phone] Mobile: [phone] After hours: [phone].

## 2019-11-14 NOTE — Progress Notes (Signed)
ANTICOAGULATION CONSULT NOTE - Follow Up Consult  Pharmacy Consult for heparin Indication: DVT and CAD  Labs: Recent Labs    10/30/2019 1034 11/25/2019 1328 11/06/2019 1655 11/16/2019 1655 11/12/19 0134 11/12/19 0134 December 10, 2019 0500 12-10-19 0500 Dec 10, 2019 0516 12/10/19 0516 10-Dec-2019 1029 12-10-2019 1054 12-10-19 2235 11/14/19 0500 11/14/19 0510  HGB   < >  --  13.8   < > 11.9*   < > 15.2   < > 15.6   < >  --  12.2*  --  11.2*  --   HCT   < >  --  40.6   < > 34.6*   < > 45.5   < > 46.0  --   --  36.0*  --  35.0*  --   PLT   < >  --  214   < > 227  --  204  --   --   --   --   --   --  163  --   APTT  --   --   --   --   --   --  26  --   --   --   --   --   --   --   --   LABPROT  --   --   --   --   --   --  17.2*  --   --   --   --   --   --   --   --   INR  --   --   --   --   --   --  1.5*  --   --   --   --   --   --   --   --   HEPARINUNFRC  --   --   --   --   --   --   --   --   --   --   --   --  0.12*  --  0.18*  CREATININE   < >  --  0.76  --  0.73  --  0.66  --   --   --   --   --   --   --   --   TROPONINIHS  --  24*  --   --   --   --  572*  --   --   --  2,953*  --   --   --   --    < > = values in this interval not displayed.    Assessment: 64yo male remains subtherapeutic on heparin after rate change; no gtt issues or signs of bleeding per RN.  Of note [place] yesterday also reveals BLE DVTs.  Goal of Therapy:  Heparin level 0.3-0.7 units/ml   Plan:  Will increase heparin gtt by 3 units/kg/hr to 1500 units/hr and check level in 6 hours.    Vernard Gambles, PharmD, BCPS  11/14/2019,6:01 AM

## 2019-11-14 NOTE — Progress Notes (Signed)
Assisted tele visit to patient with family member.  Thomas, Orvel Cutsforth Renee, RN   

## 2019-11-14 NOTE — Progress Notes (Signed)
Nutrition Follow-up  RD working remotely.  DOCUMENTATION CODES:   Not applicable  INTERVENTION:  Initiate new goal TF regimen of Vital High Protein at 25 mL/hr (600 mL goal daily volume) + PROSource TF 90 mL TID per tube. Provides 840 kcal, 118.5 grams of protein, 504 mL H2O daily. With current propofol rate provides 1421 kcal daily.  Provide MVI daily per tube.  NUTRITION DIAGNOSIS:   Increased nutrient needs related to acute illness (COVID-19 pneumonia) as evidenced by estimated needs.  Ongoing.  GOAL:   Patient will meet greater than or equal to 90% of their needs  Met with TF regimen.  MONITOR:   PO intake, Supplement acceptance, Labs, Weight trends  REASON FOR ASSESSMENT:   Malnutrition Screening Tool, Ventilator, Consult Enteral/tube feeding initiation and management  ASSESSMENT:   64 year old male who presented to the ED on 10/15 with SOB. PMH of tobacco use, EtOH abuse. Pt found to have COVID-19 pneumonia.  10/17 intubated and tube feeds initiated per protocol  Patient is currently intubated on ventilator support MV: 17.4 L/min Temp (24hrs), Avg:98.9 F (37.2 C), Min:97.2 F (36.2 C), Max:100.2 F (37.9 C)  Propofol: 22 mL/hr (581 kcal daily)  Medications reviewed and include: Decadron 6 mg [id number] per tube, folic acid 1 mg daily per tube, MVI daily per tube, thiamine 100 mg daily per tube, epinephrine gtt at 5 mcg/min, famotidine, fentanyl gtt, heparin gtt, norepinephrine gtt at 30 mcg/min, propofol gtt.  Labs reviewed: CBG 176-184, BUN 40, Creatinine 1.5, Phosphorus 5.4.  I/O: 1010 mL UOP yesterday (0.5 mL/kg/hr)  Weight trend: 82.1 kg on 10/18; +8.9 kg from 10/15  Enteral Access: 16 Fr. OGT placed 10/17; terminates in proximal gastric body per abdominal x-ray 10/17  TF regimen: Vital High Protein at 40 mL/hr + PROSource TF 45 mL BID  Discussed with RN over the phone. Patient tolerating tube feeds at 40 mL/hr started yesterday per OGT. Verified  updated rates of pressors and propofol over the phone with RN. Patient now on propofol gtt of 50 mcg/kg/min, which is approximately 22 mL/hr. Per chart plan is to prone patient today.  NUTRITION - FOCUSED PHYSICAL EXAM:  Unable to complete as RD is working remotely.  Diet Order:   Diet Order            Diet NPO time specified  Diet effective now                EDUCATION NEEDS:   No education needs have been identified at this time  Skin:  Skin Assessment: Reviewed RN Assessment  Last BM:  11/12/2019  Height:   Ht Readings from Last 1 Encounters:  11/09/2019 $RemoveB'5\' 8"'YCuYqtFh$  (1.727 m)   Weight:   Wt Readings from Last 1 Encounters:  11/14/19 82.1 kg   BMI:  Body mass index is 27.52 kg/m.  Estimated Nutritional Needs:   Kcal:  1405  Protein:  110-120 grams  Fluid:  >/= 2 L/day  Jacklynn Barnacle, MS, RD, LDN Pager number available on Amion

## 2019-11-14 NOTE — Progress Notes (Signed)
ANTICOAGULATION CONSULT NOTE   Pharmacy Consult for heparin Indication: BLE DVT  Assessment: 64yo male had been admitted 10/15 for Covid PNA, decompensated overnight and was tx'd to the ICU and intubated, ECG reveals STEMI, to start heparin while awaiting transfer to the cath lab.  [place] 10/17 revealed BLE DVTs. Heparin level this evening is at goal (0.36). No s/sx bleeding noted.  Goal of Therapy:  Heparin level 0.3-0.7 units/ml Monitor platelets by anticoagulation protocol: Yes   Plan:  Continue heparin drip at 1800 units/ml Daily HL and CBC  Thanks for allowing pharmacy to be a part of this patient's care.  Sheppard Coil PharmD., BCPS Clinical Pharmacist 11/14/2019 10:00 PM

## 2019-11-14 NOTE — Progress Notes (Addendum)
ANTICOAGULATION CONSULT NOTE   Pharmacy Consult for heparin Indication: BLE DVT  Assessment: 64yo male had been admitted 10/15 for Covid PNA, decompensated overnight and was tx'd to the ICU and intubated, ECG reveals STEMI, to start heparin while awaiting transfer to the cath lab.  Patient went to cath lab, pharmacy reconsulted to start heparin 8 hours after sheath pulled.  [place] yesterday revealed BLE DVTs. After increase in dose from 1200 units/mL to 1500 units/mL, heparin level unchanged at 0.18 IU/mL. Heparin infusing in left AC line & level drawn from left central line. This could make level appear high, not low. No s/sx bleeding.  Goal of Therapy:  Heparin level 0.3-0.7 units/ml Monitor platelets by anticoagulation protocol: Yes   Plan:  Increase heparin drip to 1800 units/ml HL 6 hours Daily HL and CBC  Thanks for allowing pharmacy to be a part of this patient's care.  Arletha Pili, PharmD Candidate 11/14/2019, 1:10 PM

## 2019-11-14 NOTE — Progress Notes (Signed)
Progress Note  Patient Name: Charles Dawson Date of Encounter: 11/14/2019  [facility] Cardiologist: No primary care provider on file.   Subjective   The patient was not physically seen.  He is sequestered due to covert 19 pneumonia.  Chart is been extensively reviewed including the coronary angiogram from yesterday.  Echo has also been evaluated.  Inpatient Medications    Scheduled Meds: . aspirin  81 mg Per Tube Daily  . atorvastatin  80 mg Per Tube Daily  . baricitinib  4 mg Oral Daily  . chlorhexidine gluconate (MEDLINE KIT)  15 mL Mouth Rinse BID  . Chlorhexidine Gluconate Cloth  6 each Topical Daily  . dexamethasone  6 mg Per Tube Q24H  . feeding supplement (PROSource TF)  45 mL Per Tube BID  . feeding supplement (VITAL HIGH PROTEIN)  1,000 mL Per Tube Q24H  . folic acid  1 mg Per Tube Daily  . mouth rinse  15 mL Mouth Rinse 10 times per day  . multivitamin with minerals  1 tablet Per Tube Daily  . sodium chloride flush  3 mL Intravenous Q12H  . thiamine  100 mg Per Tube Daily   Or  . thiamine  100 mg Intravenous Daily   Continuous Infusions: . sodium chloride Stopped (11/21/2019 1755)  . sodium chloride Stopped (11/14/19 0441)  . epinephrine 5 mcg/min (11/14/19 0600)  . famotidine (PEPCID) IV Stopped (11/23/2019 2137)  . fentaNYL infusion INTRAVENOUS 200 mcg/hr (11/14/19 0600)  . heparin 1,500 Units/hr (11/14/19 1497)  . norepinephrine (LEVOPHED) Adult infusion 33 mcg/min (11/14/19 0600)  . propofol (DIPRIVAN) infusion 40 mcg/kg/min (11/14/19 0600)  . remdesivir 100 mg in NS 100 mL Stopped (11/12/2019 1004)  . sodium chloride 500 mL/hr at 11/02/2019 2000   PRN Meds: sodium chloride, Place/Maintain arterial line **AND** sodium chloride, acetaminophen, fentaNYL, guaiFENesin-dextromethorphan, ipratropium-albuterol, ondansetron **OR** ondansetron (ZOFRAN) IV, polyethylene glycol, sodium chloride, sodium chloride, sodium chloride flush   Vital Signs    Vitals:    11/14/19 0530 11/14/19 0545 11/14/19 0600 11/14/19 0615  BP:      Pulse: 72 75 72 71  Resp: (!) $RemoveB'30 19 18 'DpcrvALs$ (!) 24  Temp: 99.5 F (37.5 C) 99.5 F (37.5 C) 99.5 F (37.5 C) 99.5 F (37.5 C)  TempSrc:      SpO2: 100% 100% 99% 100%  Weight:      Height:        Intake/Output Summary (Last 24 hours) at 11/14/2019 0804 Last data filed at 11/14/2019 0600 Gross per 24 hour  Intake 7239.28 ml  Output 1010 ml  Net 6229.28 ml   Last 3 Weights 11/14/2019 11/14/2019 11/04/2019  Weight (lbs) 181 lb 168 lb 6.9 oz 161 lb 4.8 oz  Weight (kg) 82.1 kg 76.4 kg 73.165 kg      Telemetry    Telemetry demonstrates normal sinus rhythm- Personally Reviewed  ECG    ECG this a.m. demonstrates sinus rhythm with preservation of anterior forces and precordial deep T wave inversion.- Personally Reviewed  Physical Exam   Not physically examined  Labs    High Sensitivity Troponin:   Recent Labs  Lab 10/28/2019 1034 11/07/2019 1328 11/05/2019 0500 11/12/2019 1029  TROPONINIHS 33* 24* 572* 2,953*      Chemistry Recent Labs  Lab 11/12/19 0134 11/12/19 0134 11/12/2019 0500 11/16/2019 0500 11/03/2019 0516 10/30/2019 1054 11/14/19 0500  NA 133*   < > 137   < > 140 138 141  K 3.8   < > 3.5   < >  3.5 3.4* 4.8  CL 99  --  101  --   --   --  107  CO2 24  --  26  --   --   --  22  GLUCOSE 131*  --  132*  --   --   --  170*  BUN 20  --  20  --   --   --  40*  CREATININE 0.73  --  0.66  --   --   --  1.50*  CALCIUM 7.8*  --  7.9*  --   --   --  6.9*  PROT 5.2*  --  5.6*  --   --   --  5.3*  ALBUMIN 2.0*  --  2.2*  --   --   --  1.7*  AST 61*  --  77*  --   --   --  75*  ALT 27  --  34  --   --   --  32  ALKPHOS 57  --  107  --   --   --  75  BILITOT 0.6  --  0.9  --   --   --  0.5  GFRNONAA >60  --  >60  --   --   --  48*  ANIONGAP 10  --  10  --   --   --  12   < > = values in this interval not displayed.     Hematology Recent Labs  Lab 11/12/19 0134 11/12/19 0134 11/18/2019 0500 11/18/2019 0500  11/04/2019 0516 11/15/2019 1054 11/14/19 0500  WBC 7.2  --  19.2*  --   --   --  20.4*  RBC 3.70*  --  4.83  --   --   --  3.52*  HGB 11.9*   < > 15.2   < > 15.6 12.2* 11.2*  HCT 34.6*   < > 45.5   < > 46.0 36.0* 35.0*  MCV 93.5  --  94.2  --   --   --  99.4  MCH 32.2  --  31.5  --   --   --  31.8  MCHC 34.4  --  33.4  --   --   --  32.0  RDW 13.9  --  14.0  --   --   --  15.6*  PLT 227  --  204  --   --   --  163   < > = values in this interval not displayed.    BNP Recent Labs  Lab 11/01/2019 1034  BNP 84.8     DDimer  Recent Labs  Lab 11/12/19 0134 11/27/2019 0500 11/14/19 0500  DDIMER >20.00* >20.00* >20.00*     Radiology    DG Chest 1 View  Result Date: 11/12/2019 CLINICAL DATA:  Dyspnea EXAM: CHEST  1 VIEW COMPARISON:  11/15/2019 FINDINGS: Unchanged appearance of the lungs with suspected pulmonary edema. No pleural effusion or pneumothorax. Cardiomediastinal contours are normal. IMPRESSION: Unchanged appearance of the lungs with suspected pulmonary edema. Electronically Signed   By: Ulyses Jarred M.D.   On: 11/21/2019 04:14   CARDIAC CATHETERIZATION  Result Date: 11/24/2019  Prox RCA lesion is 20% stenosed.  Dist RCA lesion is 50% stenosed.  RPDA lesion is 60% stenosed.  Ost LAD to Prox LAD lesion is 40% stenosed.  Mid LAD lesion is 90% stenosed.  Large aneurysm arising from the mid LAD/Diagonal bifurcation.  1. The LAD is  a large caliber vessel that courses to the apex. The proximal LAD has diffuse calcified disease but no focal stenosis. The mid LAD has a severe stenosis involving a moderate caliber diagonal branch. There is a large aneurysm that appears to arise from the LAD but it may arise from the proximal Diagonal. There is flow down into the distal LAD 2. No obstructive disease in the Circumflex 3. The RCA is a large dominant vessel with mild proximal stenosis and moderate distal stenosis. The PDA has a moderate ostial stenosis. 4. LVEDP 11 mmHg Recommendations:  He has severe disease in the mid LAD at the bifurcation of a diagonal branch. There is a large aneurysm involving this bifurcation. There is flow down the LAD to the apex. I suspect that the myocardial ischemia leading to the EKG changes and elevated troponin is due to this lesion in the LAD with increased demand due to sepsis and hypoxia. This is not a favorable lesion for PCI due to the aneurysm. A covered stent cannot be used as this would occlude the moderate caliber diagonal branch. He will need consideration for bypass of the LAD. I will ask CT surgery to see him today but surgery will most likely be delayed for now due to his current sepsis and pneumonia. Continue Levophed for support. Echo later today. Resume heparin 8 hours post sheath pull. Will continue ASA.  Start a statin  DG CHEST PORT 1 VIEW  Result Date: 11/04/2019 CLINICAL DATA:  Status post central line placement EXAM: PORTABLE CHEST 1 VIEW COMPARISON:  11/07/2019 FINDINGS: Endotracheal tube and gastric catheter are again seen and stable. Right subclavian central line is noted in the proximal superior vena cava. No pneumothorax is noted. Patchy airspace opacities are again identified and stable. Cardiac shadow is stable. IMPRESSION: No pneumothorax following central line placement. The remainder of the exam is stable from the previous study Electronically Signed   By: Alcide Clever M.D.   On: 11/14/2019 15:15   Portable Chest x-ray  Result Date: 11/05/2019 CLINICAL DATA:  Endotracheal tube present EXAM: PORTABLE CHEST 1 VIEW COMPARISON:  11/14/2019 FINDINGS: Endotracheal tube tip is just below the level of the clavicular heads. Enteric tube side port is just past the gastroesophageal junction. Moderate interstitial pulmonary edema is unchanged. IMPRESSION: 1. Endotracheal tube tip just below the level of the clavicular heads. 2. Enteric tube side port just past the gastroesophageal junction. Recommend advancing by 5 cm. Electronically  Signed   By: Deatra Robinson M.D.   On: 10/28/2019 05:15   DG Abd Portable 1V  Result Date: 11/15/2019 CLINICAL DATA:  NG tube placement EXAM: PORTABLE ABDOMEN - 1 VIEW COMPARISON:  None. FINDINGS: Enteric tube terminates in the proximal gastric body. Defibrillator pads overlying the left hemithorax. Nonobstructive bowel gas pattern. Mild degenerative changes of the thoracolumbar spine. IMPRESSION: Enteric tube terminates in the proximal gastric body. Electronically Signed   By: Charline Bills M.D.   On: 11/26/2019 09:07   VAS [place] LOWER EXTREMITY VENOUS (DVT)  Result Date: 11/21/2019  Lower Venous DVT Study Indications: Covid-19, elevated D-Dimer.  Limitations: Line. Comparison Study: No prior study Performing Technologist: Sherren Kerns RVS  Examination Guidelines: A complete evaluation includes B-mode imaging, spectral Doppler, color Doppler, and power Doppler as needed of all accessible portions of each vessel. Bilateral testing is considered an integral part of a complete examination. Limited examinations for reoccurring indications may be performed as noted. The reflux portion of the exam is performed with the patient in reverse  Trendelenburg.  +---------+---------------+---------+-----------+----------+-------------------+ RIGHT    CompressibilityPhasicitySpontaneityPropertiesThrombus Aging      +---------+---------------+---------+-----------+----------+-------------------+ CFV      Full           Yes      Yes                                      +---------+---------------+---------+-----------+----------+-------------------+ SFJ      Full                                                             +---------+---------------+---------+-----------+----------+-------------------+ FV Prox  Full                                                             +---------+---------------+---------+-----------+----------+-------------------+ FV Mid   Full                                                              +---------+---------------+---------+-----------+----------+-------------------+ FV DistalFull                                                             +---------+---------------+---------+-----------+----------+-------------------+ PFV      Full                                                             +---------+---------------+---------+-----------+----------+-------------------+ POP                     Yes      Yes                  patent by color and                                                       Doppler             +---------+---------------+---------+-----------+----------+-------------------+ PTV      None                                         Acute               +---------+---------------+---------+-----------+----------+-------------------+ PERO     None  Acute               +---------+---------------+---------+-----------+----------+-------------------+ Gastroc  None                                         Acute               +---------+---------------+---------+-----------+----------+-------------------+   +---------+---------------+---------+-----------+----------+--------------+ LEFT     CompressibilityPhasicitySpontaneityPropertiesThrombus Aging +---------+---------------+---------+-----------+----------+--------------+ CFV      Full           Yes      Yes                                 +---------+---------------+---------+-----------+----------+--------------+ SFJ      Full                                                        +---------+---------------+---------+-----------+----------+--------------+ FV Prox  Full                                                        +---------+---------------+---------+-----------+----------+--------------+ FV Mid   Partial                                      Acute           +---------+---------------+---------+-----------+----------+--------------+ FV DistalNone                                         Acute          +---------+---------------+---------+-----------+----------+--------------+ PFV      Full                                                        +---------+---------------+---------+-----------+----------+--------------+ POP      Full           No       No                                  +---------+---------------+---------+-----------+----------+--------------+ PTV      Full                                                        +---------+---------------+---------+-----------+----------+--------------+ PERO     Full                                                        +---------+---------------+---------+-----------+----------+--------------+  Gastroc  None                                         Acute          +---------+---------------+---------+-----------+----------+--------------+     Summary: RIGHT: - Findings consistent with acute deep vein thrombosis involving the right posterior tibial veins, right peroneal veins, and right gastrocnemius veins.  LEFT: - Findings consistent with acute deep vein thrombosis involving the left femoral vein, and left gastrocnemius veins.  *See table(s) above for measurements and observations. Electronically signed by Monica Martinez MD on 11/18/2019 at 3:01:38 PM.    Final    ECHOCARDIOGRAM LIMITED  Result Date: 11/26/2019    ECHOCARDIOGRAM REPORT   Patient Name:   Charles Dawson Date of Exam: 11/23/2019 Medical Rec #:  [id number]      Height:       68.0 in Accession #:    [id number]     Weight:       168.4 lb Date of Birth:  [birth year]       BSA:          1.900 m Patient Age:    8 years       BP:           91/58 mmHg Patient Gender: M              HR:           80 bpm. Exam Location:  Inpatient Procedure: Limited Echo, Cardiac Doppler and Color Doppler Indications:    Acute Cornonary  Syndrome I24.9  History:        Patient has no prior history of Echocardiogram examinations.                 Acute MI; Risk Factors:Former Smoker. COVID-19.  Sonographer:    Clayton Lefort RDCS (AE) Referring Phys: [place] Comments: Echo performed with patient supine and on artificial respirator. Image acquisition challenging due to respiratory motion. Limited windows due to chest wall deformity. IMPRESSIONS  1. Left ventricular ejection fraction, by estimation, is 35 to 40%. The left ventricle has moderately decreased function. The left ventricle demonstrates regional wall motion abnormalities (see scoring diagram/findings for description). There is moderate left ventricular hypertrophy. Left ventricular diastolic parameters are consistent with Grade I diastolic dysfunction (impaired relaxation). There is severe hypokinesis of the left ventricular, mid-apical anterior wall, anteroseptal wall and apical segment.  2. Right ventricular systolic function is mildly reduced. The right ventricular size is normal. There is normal pulmonary artery systolic pressure.  3. The mitral valve is abnormal. Trivial mitral valve regurgitation.  4. The aortic valve is tricuspid. Aortic valve regurgitation is not visualized.  5. Aortic dilatation noted. There is mild dilatation of the aortic root, measuring 39 mm.  6. The inferior vena cava is normal in size with greater than 50% respiratory variability, suggesting right atrial pressure of 3 mmHg. FINDINGS  Left Ventricle: Left ventricular ejection fraction, by estimation, is 35 to 40%. The left ventricle has moderately decreased function. The left ventricle demonstrates regional wall motion abnormalities. Severe hypokinesis of the left ventricular, mid-apical anterior wall, anteroseptal wall and apical segment. The left ventricular internal cavity size was normal in size. There is moderate left ventricular hypertrophy. Left ventricular diastolic  parameters are consistent with Grade I diastolic dysfunction (impaired relaxation). Indeterminate filling pressures. Right Ventricle: The right  ventricular size is normal. No increase in right ventricular wall thickness. Right ventricular systolic function is mildly reduced. There is normal pulmonary artery systolic pressure. The tricuspid regurgitant velocity is 2.52 m/s, and with an assumed right atrial pressure of 3 mmHg, the estimated right ventricular systolic pressure is 28.4 mmHg. Left Atrium: Left atrial size was normal in size. Right Atrium: Right atrial size was normal in size. Pericardium: There is no evidence of pericardial effusion. Mitral Valve: The mitral valve is abnormal. There is mild thickening of the mitral valve leaflet(s). Trivial mitral valve regurgitation. Tricuspid Valve: The tricuspid valve is grossly normal. Tricuspid valve regurgitation is trivial. Aortic Valve: The aortic valve is tricuspid. Aortic valve regurgitation is not visualized. Aortic valve mean gradient measures 3.0 mmHg. Aortic valve peak gradient measures 4.4 mmHg. Aortic valve area, by VTI measures 2.67 cm. Pulmonic Valve: The pulmonic valve was normal in structure. Pulmonic valve regurgitation is not visualized. Aorta: Aortic dilatation noted. There is mild dilatation of the aortic root, measuring 39 mm. Venous: The inferior vena cava is normal in size with greater than 50% respiratory variability, suggesting right atrial pressure of 3 mmHg. IAS/Shunts: No atrial level shunt detected by color flow Doppler.  LEFT VENTRICLE PLAX 2D LVIDd:         4.60 cm  Diastology LVIDs:         3.30 cm  LV e' medial:    3.99 cm/s LV PW:         1.30 cm  LV E/e' medial:  15.4 LV IVS:        1.20 cm  LV e' lateral:   5.29 cm/s LVOT diam:     2.10 cm  LV E/e' lateral: 11.6 LV SV:         50 LV SV Index:   26 LVOT Area:     3.46 cm  IVC IVC diam: 1.60 cm LEFT ATRIUM         Index LA diam:    3.90 cm 2.05 cm/m  AORTIC VALVE AV Area (Vmax):     3.01 cm AV Area (Vmean):   2.91 cm AV Area (VTI):     2.67 cm AV Vmax:           105.00 cm/s AV Vmean:          78.800 cm/s AV VTI:            0.188 m AV Peak Grad:      4.4 mmHg AV Mean Grad:      3.0 mmHg LVOT Vmax:         91.40 cm/s LVOT Vmean:        66.100 cm/s LVOT VTI:          0.145 m LVOT/AV VTI ratio: 0.77  AORTA Ao Root diam: 3.60 cm Ao Asc diam:  3.90 cm MITRAL VALVE               TRICUSPID VALVE MV Area (PHT): 3.65 cm    TR Peak grad:   25.4 mmHg MV Decel Time: 208 msec    TR Vmax:        252.00 cm/s MV E velocity: 61.30 cm/s MV A velocity: 69.00 cm/s  SHUNTS MV E/A ratio:  0.89        Systemic VTI:  0.14 m                            Systemic Diam: 2.10  cm Lyman Bishop MD Electronically signed by Lyman Bishop MD Signature Date/Time: 11/25/2019/2:01:26 PM    Final     Cardiac Studies   ECHOCARDIOGRAPHY 11/03/2019: IMPRESSIONS    1. Left ventricular ejection fraction, by estimation, is 35 to 40%. The  left ventricle has moderately decreased function. The left ventricle  demonstrates regional wall motion abnormalities (see scoring  diagram/findings for description). There is  moderate left ventricular hypertrophy. Left ventricular diastolic  parameters are consistent with Grade I diastolic dysfunction (impaired  relaxation). There is severe hypokinesis of the left ventricular,  mid-apical anterior wall, anteroseptal wall and  apical segment.  2. Right ventricular systolic function is mildly reduced. The right  ventricular size is normal. There is normal pulmonary artery systolic  pressure.  3. The mitral valve is abnormal. Trivial mitral valve regurgitation.  4. The aortic valve is tricuspid. Aortic valve regurgitation is not  visualized.  5. Aortic dilatation noted. There is mild dilatation of the aortic root,  measuring 39 mm.  6. The inferior vena cava is normal in size with greater than 50%  respiratory variability, suggesting right atrial pressure of 3 mmHg.    Cardiac cath 11/22/2019: 1. The LAD is a large caliber vessel that courses to the apex. The proximal LAD has diffuse calcified disease but no focal stenosis. The mid LAD has a severe stenosis involving a moderate caliber diagonal branch. There is a large aneurysm that appears to arise from the LAD but it may arise from the proximal Diagonal. There is flow down into the distal LAD 2. No obstructive disease in the Circumflex 3. The RCA is a large dominant vessel with mild proximal stenosis and moderate distal stenosis. The PDA has a moderate ostial stenosis. 4. LVEDP 11 mmHg Diagnostic Dominance: Right    Patient Profile     64 y.o. male Covid 47 positive with PNA and  hypoxia, intubated, ETOH abuse, COPD, and acute coronary syndrome/STEMI 11/01/2019.  Assessment & Plan    1. 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  Coronary disease is not currently treatable due to a large saccular aneurysm that arises at the site of the high-grade stenosis.  May need to have a covered stent versus coronary bypass grafting with aneurysm ligation but this will only be possible when he has recovered from COVID-19 pneumonia and hypercoagulability.  We will get TCTS consult later in hospital course assuming he remains stable.  Continue IV heparin. 2. Covid 19 PNA with repiratory failure: Maintaining O2 saturation of 100% on mechanical ventilation. 3. Acute systolic heart failure: Follow chest x-rays closely: He will be prone to volume overload. 4. Shock: Continue pressor therapy as needed to support blood pressure.     For questions or updates, please contact [place] Please consult [email or web address] for contact info under        Signed, Sinclair Grooms, MD  11/14/2019, 8:04 AM

## 2019-11-15 DIAGNOSIS — J1282 Pneumonia due to coronavirus disease 2019: Secondary | ICD-10-CM | POA: Diagnosis not present

## 2019-11-15 DIAGNOSIS — U071 COVID-19: Secondary | ICD-10-CM | POA: Diagnosis not present

## 2019-11-15 LAB — COMPREHENSIVE METABOLIC PANEL
ALT: 29 U/L (ref 0–44)
AST: 51 U/L — ABNORMAL HIGH (ref 15–41)
Albumin: 1.7 g/dL — ABNORMAL LOW (ref 3.5–5.0)
Alkaline Phosphatase: 69 U/L (ref 38–126)
Anion gap: 8 (ref 5–15)
BUN: 53 mg/dL — ABNORMAL HIGH (ref 8–23)
CO2: 25 mmol/L (ref 22–32)
Calcium: 7.3 mg/dL — ABNORMAL LOW (ref 8.9–10.3)
Chloride: 110 mmol/L (ref 98–111)
Creatinine, Ser: 1.87 mg/dL — ABNORMAL HIGH (ref 0.61–1.24)
GFR, Estimated: 37 mL/min — ABNORMAL LOW (ref >60)
Glucose, Bld: 120 mg/dL — ABNORMAL HIGH (ref 70–99)
Potassium: 5.1 mmol/L (ref 3.5–5.1)
Sodium: 143 mmol/L (ref 135–145)
Total Bilirubin: 0.6 mg/dL (ref 0.3–1.2)
Total Protein: 5.1 g/dL — ABNORMAL LOW (ref 6.5–8.1)

## 2019-11-15 LAB — CBC WITH DIFFERENTIAL/PLATELET
Abs Immature Granulocytes: 0.31 10*3/uL — ABNORMAL HIGH (ref 0.00–0.07)
Basophils Absolute: 0 10*3/uL (ref 0.0–0.1)
Basophils Relative: 0 %
Eosinophils Absolute: 0 10*3/uL (ref 0.0–0.5)
Eosinophils Relative: 0 %
HCT: 32.9 % — ABNORMAL LOW (ref 39.0–52.0)
Hemoglobin: 10.4 g/dL — ABNORMAL LOW (ref 13.0–17.0)
Immature Granulocytes: 2 %
Lymphocytes Relative: 10 %
Lymphs Abs: 1.2 10*3/uL (ref 0.7–4.0)
MCH: 31.5 pg (ref 26.0–34.0)
MCHC: 31.6 g/dL (ref 30.0–36.0)
MCV: 99.7 fL (ref 80.0–100.0)
Monocytes Absolute: 0.4 10*3/uL (ref 0.1–1.0)
Monocytes Relative: 3 %
Neutro Abs: 10.8 10*3/uL — ABNORMAL HIGH (ref 1.7–7.7)
Neutrophils Relative %: 85 %
Platelets: 222 10*3/uL (ref 150–400)
RBC: 3.3 MIL/uL — ABNORMAL LOW (ref 4.22–5.81)
RDW: 16 % — ABNORMAL HIGH (ref 11.5–15.5)
WBC: 12.7 10*3/uL — ABNORMAL HIGH (ref 4.0–10.5)
nRBC: 0 % (ref 0.0–0.2)

## 2019-11-15 LAB — POCT I-STAT 7, (LYTES, BLD GAS, ICA,H+H)
Acid-base deficit: 1 mmol/L (ref 0.0–2.0)
Bicarbonate: 27.1 mmol/L (ref 20.0–28.0)
Calcium, Ion: 1.08 mmol/L — ABNORMAL LOW (ref 1.15–1.40)
HCT: 29 % — ABNORMAL LOW (ref 39.0–52.0)
Hemoglobin: 9.9 g/dL — ABNORMAL LOW (ref 13.0–17.0)
O2 Saturation: 89 %
Potassium: 5.2 mmol/L — ABNORMAL HIGH (ref 3.5–5.1)
Sodium: 142 mmol/L (ref 135–145)
TCO2: 29 mmol/L (ref 22–32)
pCO2 arterial: 59.2 mmHg — ABNORMAL HIGH (ref 32.0–48.0)
pH, Arterial: 7.268 — ABNORMAL LOW (ref 7.350–7.450)
pO2, Arterial: 66 mmHg — ABNORMAL LOW (ref 83.0–108.0)

## 2019-11-15 LAB — GLUCOSE, CAPILLARY
Glucose-Capillary: 115 mg/dL — ABNORMAL HIGH (ref 70–99)
Glucose-Capillary: 108 mg/dL — ABNORMAL HIGH (ref 70–99)
Glucose-Capillary: 109 mg/dL — ABNORMAL HIGH (ref 70–99)
Glucose-Capillary: 97 mg/dL (ref 70–99)
Glucose-Capillary: 96 mg/dL (ref 70–99)
Glucose-Capillary: 82 mg/dL (ref 70–99)

## 2019-11-15 LAB — C-REACTIVE PROTEIN: CRP: 24.6 mg/dL — ABNORMAL HIGH (ref <1.0)

## 2019-11-15 LAB — FERRITIN: Ferritin: 444 ng/mL — ABNORMAL HIGH (ref 24–336)

## 2019-11-15 LAB — HEPARIN LEVEL (UNFRACTIONATED): Heparin Unfractionated: 0.5 IU/mL (ref 0.30–0.70)

## 2019-11-15 LAB — PHOSPHORUS: Phosphorus: 4.4 mg/dL (ref 2.5–4.6)

## 2019-11-15 LAB — D-DIMER, QUANTITATIVE: D-Dimer, Quant: 9.76 ug/mL-FEU — ABNORMAL HIGH (ref 0.00–0.50)

## 2019-11-15 LAB — MAGNESIUM: Magnesium: 2.6 mg/dL — ABNORMAL HIGH (ref 1.7–2.4)

## 2019-11-15 MED ORDER — SENNOSIDES-DOCUSATE SODIUM 8.6-50 MG PO TABS
1.0000 | ORAL_TABLET | Freq: Two times a day (BID) | ORAL | Status: DC
  Administered 2019-11-15: 1 via ORAL
  Filled 2019-11-15 (×2): qty 1

## 2019-11-15 MED ORDER — MIDAZOLAM BOLUS VIA INFUSION
1.0000 mg | INTRAVENOUS | Status: DC | PRN
  Administered 2019-11-18 – 2019-11-19 (×2): 2 mg via INTRAVENOUS
  Filled 2019-11-15: qty 2

## 2019-11-15 MED ORDER — SODIUM CHLORIDE 0.9 % IV SOLN
2.0000 g | Freq: Two times a day (BID) | INTRAVENOUS | Status: DC
  Administered 2019-11-15 (×2): 2 g via INTRAVENOUS
  Filled 2019-11-15 (×2): qty 2

## 2019-11-15 MED ORDER — MIDAZOLAM 50MG/50ML (1MG/ML) PREMIX INFUSION
0.5000 mg/h | INTRAVENOUS | Status: DC
  Administered 2019-11-15: 0.5 mg/h via INTRAVENOUS
  Administered 2019-11-15: 8 mg/h via INTRAVENOUS
  Administered 2019-11-15: 6 mg/h via INTRAVENOUS
  Filled 2019-11-15 (×3): qty 50

## 2019-11-15 MED ORDER — VANCOMYCIN HCL 1250 MG/250ML IV SOLN
1250.0000 mg | INTRAVENOUS | Status: DC
  Administered 2019-11-15: 1250 mg via INTRAVENOUS
  Filled 2019-11-15 (×2): qty 250

## 2019-11-15 MED ORDER — FENTANYL 2500MCG IN NS 250ML (10MCG/ML) PREMIX INFUSION
25.0000 ug/h | INTRAVENOUS | Status: DC
  Administered 2019-11-16: 250 ug/h via INTRAVENOUS
  Administered 2019-11-16: 300 ug/h via INTRAVENOUS
  Administered 2019-11-16: 250 ug/h via INTRAVENOUS
  Administered 2019-11-17 – 2019-11-18 (×4): 300 ug/h via INTRAVENOUS
  Administered 2019-11-19: 75 ug/h via INTRAVENOUS
  Filled 2019-11-15 (×8): qty 250

## 2019-11-15 MED ORDER — CISATRACURIUM BESYLATE 20 MG/10ML IV SOLN
0.1000 mg/kg | INTRAVENOUS | Status: DC | PRN
  Administered 2019-11-15 – 2019-11-17 (×5): 8.2 mg via INTRAVENOUS
  Filled 2019-11-15 (×4): qty 10

## 2019-11-15 MED ORDER — MIDAZOLAM 50MG/50ML (1MG/ML) PREMIX INFUSION
2.0000 mg/h | INTRAVENOUS | Status: DC
  Administered 2019-11-16: 8 mg/h via INTRAVENOUS
  Administered 2019-11-16: 10 mg/h via INTRAVENOUS
  Administered 2019-11-16 – 2019-11-17 (×3): 8 mg/h via INTRAVENOUS
  Administered 2019-11-17 – 2019-11-18 (×4): 10 mg/h via INTRAVENOUS
  Administered 2019-11-18: 9 mg/h via INTRAVENOUS
  Administered 2019-11-18: 7 mg/h via INTRAVENOUS
  Administered 2019-11-19: 6 mg/h via INTRAVENOUS
  Administered 2019-11-19: 7 mg/h via INTRAVENOUS
  Administered 2019-11-19: 10 mg/h via INTRAVENOUS
  Filled 2019-11-15 (×15): qty 50

## 2019-11-15 MED ORDER — ARTIFICIAL TEARS OPHTHALMIC OINT
1.0000 "application " | TOPICAL_OINTMENT | Freq: Three times a day (TID) | OPHTHALMIC | Status: DC
  Administered 2019-11-15 – 2019-11-19 (×12): 1 via OPHTHALMIC
  Filled 2019-11-15 (×3): qty 3.5

## 2019-11-15 MED ORDER — FENTANYL CITRATE (PF) 100 MCG/2ML IJ SOLN
25.0000 ug | Freq: Once | INTRAMUSCULAR | Status: DC
  Filled 2019-11-15: qty 2

## 2019-11-15 MED ORDER — FENTANYL BOLUS VIA INFUSION
25.0000 ug | INTRAVENOUS | Status: DC | PRN
  Administered 2019-11-17 – 2019-11-19 (×3): 25 ug via INTRAVENOUS
  Filled 2019-11-15: qty 25

## 2019-11-15 MED ORDER — SENNOSIDES-DOCUSATE SODIUM 8.6-50 MG PO TABS
1.0000 | ORAL_TABLET | Freq: Two times a day (BID) | ORAL | Status: DC
  Administered 2019-11-15 – 2019-11-19 (×8): 1
  Filled 2019-11-15 (×8): qty 1

## 2019-11-15 MED ORDER — SODIUM CHLORIDE 0.9 % IV SOLN: 1.0000 g | Freq: Two times a day (BID) | INTRAVENOUS | Status: DC

## 2019-11-15 MED ORDER — VANCOMYCIN HCL IN DEXTROSE 1-5 GM/200ML-% IV SOLN: 1000.0000 mg | INTRAVENOUS | Status: DC

## 2019-11-15 MED ORDER — MIDAZOLAM BOLUS VIA INFUSION
2.0000 mg | INTRAVENOUS | Status: DC | PRN
  Administered 2019-11-15: 2 mg via INTRAVENOUS
  Filled 2019-11-15: qty 2

## 2019-11-15 MED ORDER — QUETIAPINE FUMARATE 50 MG PO TABS
50.0000 mg | ORAL_TABLET | Freq: Two times a day (BID) | ORAL | Status: DC
  Administered 2019-11-15 – 2019-11-17 (×6): 50 mg
  Filled 2019-11-15 (×6): qty 1

## 2019-11-15 MED ORDER — VASOPRESSIN 20 UNITS/100 ML INFUSION FOR SHOCK
0.0000 [IU]/min | INTRAVENOUS | Status: DC
  Administered 2019-11-15 – 2019-11-17 (×5): 0.03 [IU]/min via INTRAVENOUS
  Filled 2019-11-15 (×5): qty 100

## 2019-11-15 NOTE — Progress Notes (Signed)
Pharmacy Antibiotic Note  Charles Dawson is a 64 y.o. male admitted on 11/20/2019 with Covid pneumonia.  Pharmacy has been consulted for Vancomycin and Cefepime dosing.  Plan: Cefepime 2 grams iv Q 12 hours Vancomycin 1250 mg iv Q 24 hours Stop date in place for 7 days  Height: 5\' 8"  (172.7 cm) Weight: 82.1 kg (181 lb) IBW/kg (Calculated) : 68.4  Temp (24hrs), Avg:99.5 F (37.5 C), Min:98.6 F (37 C), Max:100.9 F (38.3 C)  Recent Labs  Lab 11/12/2019 1655 11/08/2019 1655 11/14/2019 2044 11/12/19 0134 11/12/19 0455 11/25/2019 0500 11/07/2019 0513 11/13/19 1029 11/14/19 0500 11/15/19 0433  WBC 10.3  --   --  7.2  --  19.2*  --   --  20.4* 12.7*  CREATININE 0.76  --   --  0.73  --  0.66  --   --  1.50* 1.87*  LATICACIDVEN 2.8*   < > 3.4* 2.4* 2.2*  --  3.2* 3.0*  --   --    < > = values in this interval not displayed.    Estimated Creatinine Clearance: 40.6 mL/min (A) (by C-G formula based on SCr of 1.87 mg/dL (H)).    No Known Allergies  Thank you for allowing pharmacy to be a part of this patient's care.  11/17/19 11/15/2019 2:59 PM

## 2019-11-15 NOTE — Progress Notes (Signed)
eLink Physician-Brief Progress Note Patient Name: Charles Dawson DOB: 1955/07/29 MRN: [id number]   Date of Service  11/15/2019  HPI/Events of Note  Ventilator asynchrony -  Well sedated with Fentanyl and Versed IV infusions.   eICU Interventions  Plan: 1. Intermittent NMB with Nimbex per PCCM NMB protocol.  2. ABG STAT.     Intervention Category Major Interventions: Respiratory failure - evaluation and management;Other:  Lenell Antu 11/15/2019, 9:11 PM

## 2019-11-15 NOTE — Progress Notes (Signed)
Chart reviewed.

## 2019-11-15 NOTE — Progress Notes (Signed)
eLink Physician-Brief Progress Note Patient Name: Charles Dawson DOB: 12-Dec-1955 MRN: [id number]   Date of Service  11/15/2019  HPI/Events of Note  routine AM ABG 7.26, 59.2, 66, 27.1  vent set: TV 520 (7cc/kg), Peep 12, Rate 30  eICU Interventions  Continue same.      Intervention Category Minor Interventions: Other:  Ranee Gosselin 11/15/2019, 5:03 AM

## 2019-11-15 NOTE — Progress Notes (Signed)
NAME:  Charles Dawson, MRN:  [id number], DOB:  [birth year], LOS: 4 ADMISSION DATE:  Nov 27, 2019, CONSULTATION DATE: 11/16/2019 REFERRING MD: Internal medicine residency service, CHIEF COMPLAINT: Patient with COVID-19  Brief History   Patient is a 64 year old admitted yesterday for complaints of shortness of breath hypoxemia found to be Covid positive with COVID pneumonia by chest x-ray and clinical exam.  History of present illness   Patient is a 64 year old admitted yesterday for complaints of shortness of breath hypoxemia found to be Covid positive with COVID pneumonia by chest x-ray and clinical exam.  Patient was started on remdesivir and Decadron. I was asked to see the patient urgently due to desaturation.  The patient had earlier in the evening become combative.  He has a questionable history of heavy alcohol abuse.  He drinks about 4 cases of beer a week.  The general feeling at that time was that he was in alcohol withdrawal with worsening hypoxemia due to his Covid pneumonia.  On my evaluation the patient was extremely agitated pulling his oxygen off oxygen saturations in the mid 70s.  Is urgently transferred to the ICU and intubated.  During intubation his oxygen saturations remained in the 60s.  At the start of intubation his oxygen saturation was 70%.  After the ET tube was placed there was significant frothy red secretions.  Follow-up chest x-ray is still pending. Intubation the patient was placed on PEEP of 14, 100% oxygen saturation came back up into the 90s.  Past Medical History   Past Medical History:  Diagnosis Date  . Anxiety   . 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  Less tachypneic.  Continues to require high-dose  norepinephrine.  Objective   Blood pressure 109/70, pulse 75, temperature 99 F (37.2 C), resp. rate (!) 32, height 5\' 8"  (1.727 m), weight 82.1 kg, SpO2 97 %. CVP:  [7 mmHg-10 mmHg] 7 mmHg  Vent Mode: PRVC FiO2 (%):  [40 %] 40 % Set Rate:  [30 bmp] 30 bmp Vt Set:  [520 mL] 520 mL PEEP:  [12 cmH20] 12 cmH20 Plateau Pressure:  [24 cmH20-25 cmH20] 24 cmH20   Intake/Output Summary (Last 24 hours) at 11/15/2019 0954 Last data filed at 11/15/2019 0700 Gross per 24 hour  Intake 3780.39 ml  Output 2070 ml  Net 1710.39 ml   Filed Weights   10/28/2019 0545 11/14/19 0500 11/15/19 0500  Weight: 76.4 kg 82.1 kg 82.1 kg   CVP:  [7 mmHg-10 mmHg] 7 mmHg  Examination: General: Thin ill kempt man intubated. HENT: ET tube and OG tube in place Lungs: Clear anteriorly.  Less dyssynchrony than yesterday.   Cardiovascular: Extremities warm heart sounds are unremarkable. Abdomen: Benign bowel sounds positive abdomen is soft, tolerating tube feeds Extremities: No edema Neuro: Sedated no response to painful stimuli. GU: Catheter in place, amber urine  Resolved Hospital Problem list   NA  Assessment & Plan:  Critically ill due to hypoxemia secondary to Covid pneumonia.  -Wean PEEP to 10.  ST elevation MI-not amenable to percutaneous revascularization. -Start medical management: Aspirin, clopidogrel and rosuvastatin started. -Echocardiogram to assess LV function -Initiate beta-blocker, ACE inhibitor as blood pressure and heart rate allow. -Surgical assessment once Covid resolves.  Multifactorial shock due to COVID-19 pneumonia, sedative use, cardiogenic shock requiring titration of norepinephrine and epinephrine. -  Stop propofol, transition to low-dose midazolam infusion which should help vasopressor weaning. -Wean norepinephrine to keep MAP greater than 65 -Continue epinephrine at current dose until norepinephrine dose less than 15  Possible alcohol withdrawal alcohol withdrawal, however may  have been symptoms of myocardial infarction. -Continue enteral benzodiazepines, add Seroquel. -Once off IV propofol will start benzodiazepine taper.  Acute deep vein thrombosis involving the left femoral vein, and left gastrocnemius veins.  -Continue IV heparin.  Covid pneumonia -Continue remdesivir and Decadron - High CRP but but appears to be weaning from ventilator.  We will hold on Biologics at this time.  Acute kidney injury -slow worsening but no indications for dialysis at this time -Maintain euvolemia and normotension. -Follow kidney function -Development of severe renal failure requiring renal replacement were portend a poor prognosis.  Staph epidermidis positive blood culture likely contaminant -No intervention required  Best practice:  Diet: Tolerating tube feeds, start bowel regimen. Pain/Anxiety/Delirium protocol (if indicated): Wean propofol use IV fentanyl. Target RASS -2, adding enteral sedatives. VAP protocol (if indicated): Yes DVT prophylaxis: IV heparin for active DVT GI prophylaxis: Pepcid Glucose control: Stress hyperglycemia well controlled on sliding scale insulin alone. Mobility: Bedrest Code Status: Full code Family Communication:  Brother Roe Coombs was updated 10/18.  Confirmed that the patient was not in the best of health and continued heavy drug use.  I informed him that the prognosis was guarded and that further organ would portend a poor prognosis and that a transition to comfort care would be appropriate if he were to not improve over the next week.  Don asked that we update his brother Annette Stable who has power of attorney. Disposition: ICU  Labs   CBC: Recent Labs  Lab 11/20/2019 1034 11/17/2019 1034 10/28/2019 1655 11/03/2019 1655 11/12/19 0134 11/12/19 0134 10/31/2019 0500 10/30/2019 0516 11/23/2019 1054 11/14/19 0500 11/14/19 1204 11/15/19 0415 11/15/19 0433  WBC 12.6*   < > 10.3  --  7.2  --  19.2*  --   --  20.4*  --   --  12.7*  NEUTROABS 12.5*  --   --    --  5.5  --  16.4*  --   --  18.0*  --   --  10.8*  HGB 13.9   < > 13.8   < > 11.9*   < > 15.2   < > 12.2* 11.2* 10.5* 9.9* 10.4*  HCT 40.5   < > 40.6   < > 34.6*   < > 45.5   < > 36.0* 35.0* 31.0* 29.0* 32.9*  MCV 93.1   < > 92.7  --  93.5  --  94.2  --   --  99.4  --   --  99.7  PLT 224   < > 214  --  227  --  204  --   --  163  --   --  222   < > = values in this interval not displayed.    Basic Metabolic Panel: Recent Labs  Lab 10/29/2019 1034 11/26/2019 1034 11/20/2019 1655 11/12/19 0134 11/12/19 0134 11/26/2019 0500 11/02/2019 0516 11/22/2019 1054 11/14/19 0500 11/14/19 1204 11/15/19 0415 11/15/19 0433  NA 130*   < >  --  133*   < > 137   < > 138 141 142 142 143  K 3.7   < >  --  3.8   < > 3.5   < > 3.4* 4.8 4.5 5.2* 5.1  CL 93*  --   --  99  --  101  --   --  107  --   --  110  CO2 26  --   --  24  --  26  --   --  22  --   --  25  GLUCOSE 111*  --   --  131*  --  132*  --   --  170*  --   --  120*  BUN 24*  --   --  20  --  20  --   --  40*  --   --  53*  CREATININE 0.89   < > 0.76 0.73  --  0.66  --   --  1.50*  --   --  1.87*  CALCIUM 8.0*  --   --  7.8*  --  7.9*  --   --  6.9*  --   --  7.3*  MG  --   --   --  2.1  --  2.1  --   --  2.4  --   --  2.6*  PHOS  --   --   --  3.1  --  3.9  --   --  5.4*  --   --  4.4   < > = values in this interval not displayed.   GFR: Estimated Creatinine Clearance: 40.6 mL/min (A) (by C-G formula based on SCr of 1.87 mg/dL (H)). Recent Labs  Lab 11/21/2019 1034 11/20/2019 1655 11/12/19 0134 11/12/19 0455 Dec 04, 2019 0500 December 04, 2019 0513 04-Dec-2019 1029 11/14/19 0500 11/15/19 0433  PROCALCITON 0.56  --   --   --   --   --   --   --   --   WBC 12.6*   < > 7.2  --  19.2*  --   --  20.4* 12.7*  LATICACIDVEN 2.4*   < > 2.4* 2.2*  --  3.2* 3.0*  --   --    < > = values in this interval not displayed.    Liver Function Tests: Recent Labs  Lab 11/15/2019 1034 11/12/19 0134 04-Dec-2019 0500 11/14/19 0500 11/15/19 0433  AST 76* 61* 77* 75* 51*   ALT 32 27 34 32 29  ALKPHOS 70 57 107 75 69  BILITOT 0.7 0.6 0.9 0.5 0.6  PROT 5.5* 5.2* 5.6* 5.3* 5.1*  ALBUMIN 2.2* 2.0* 2.2* 1.7* 1.7*   No results for input(s): LIPASE, AMYLASE in the last 168 hours. No results for input(s): AMMONIA in the last 168 hours.  ABG    Component Value Date/Time   PHART 7.268 (L) 11/15/2019 0415   PCO2ART 59.2 (H) 11/15/2019 0415   PO2ART 66 (L) 11/15/2019 0415   HCO3 27.1 11/15/2019 0415   TCO2 29 11/15/2019 0415   ACIDBASEDEF 1.0 11/15/2019 0415   O2SAT 89.0 11/15/2019 0415     Coagulation Profile: Recent Labs  Lab Dec 04, 2019 0500  INR 1.5*   HbA1C: No results found for: HGBA1C  CBG: Recent Labs  Lab 11/14/19 1637 11/14/19 1920 11/14/19 2349 11/15/19 0357 11/15/19 0735  GLUCAP 163* 133* 136* 115* 108*   Cardiac Panel (last 3 results) Recent Labs    12/04/2019 0500 12-04-2019 1029  TROPONINIHS 572* 2,953*   CRITICAL CARE Performed by: Lynnell Catalan   Total critical care time: 40 minutes  Critical care time was exclusive of separately billable procedures and treating other patients.  Critical care was necessary to treat or prevent imminent or life-threatening deterioration.  Critical care was  time spent personally by me on the following activities: development of treatment plan with patient and/or surrogate as well as nursing, discussions with consultants, evaluation of patient's response to treatment, examination of patient, obtaining history from patient or surrogate, ordering and performing treatments and interventions, ordering and review of laboratory studies, ordering and review of radiographic studies, pulse oximetry, re-evaluation of patient's condition and participation in multidisciplinary rounds.  Lynnell Catalanavi Emmalia Heyboer, MD [facility] ICU Physician [facility] [place] [facility]  Pager: [phone] Mobile: [phone] After hours: [phone].

## 2019-11-15 NOTE — Progress Notes (Signed)
ANTICOAGULATION CONSULT NOTE   Pharmacy Consult for heparin Indication: BLE DVT  Assessment: 64yo male had been admitted 10/15 for Covid PNA, decompensated overnight and was tx'd to the ICU and intubated, ECG reveals STEMI, to start heparin while awaiting transfer to the cath lab. Patient went to cath lab, pharmacy was reconsulted to start heparin 8 hours after sheath pulled.  [place] 10/17 revealed BLE DVTs. Heparin level is at goal (0.5). No s/sx bleeding noted.  Goal of Therapy:  Heparin level 0.3-0.7 units/ml Monitor platelets by anticoagulation protocol: Yes   Plan:  Continue heparin drip at 1800 units/ml Daily HL and CBC  Thanks for allowing pharmacy to be a part of this patient's care.  Arletha Pili, PharmD Candidate 11/15/2019 6:31 AM

## 2019-11-16 DIAGNOSIS — J1282 Pneumonia due to coronavirus disease 2019: Secondary | ICD-10-CM | POA: Diagnosis not present

## 2019-11-16 DIAGNOSIS — U071 COVID-19: Secondary | ICD-10-CM | POA: Diagnosis not present

## 2019-11-16 LAB — BASIC METABOLIC PANEL
Anion gap: 11 (ref 5–15)
Anion gap: 10 (ref 5–15)
BUN: 83 mg/dL — ABNORMAL HIGH (ref 8–23)
BUN: 93 mg/dL — ABNORMAL HIGH (ref 8–23)
CO2: 22 mmol/L (ref 22–32)
CO2: 24 mmol/L (ref 22–32)
Calcium: 6.9 mg/dL — ABNORMAL LOW (ref 8.9–10.3)
Calcium: 6.7 mg/dL — ABNORMAL LOW (ref 8.9–10.3)
Chloride: 110 mmol/L (ref 98–111)
Chloride: 110 mmol/L (ref 98–111)
Creatinine, Ser: 2.88 mg/dL — ABNORMAL HIGH (ref 0.61–1.24)
Creatinine, Ser: 3.09 mg/dL — ABNORMAL HIGH (ref 0.61–1.24)
GFR, Estimated: 22 mL/min — ABNORMAL LOW (ref >60)
GFR, Estimated: 20 mL/min — ABNORMAL LOW (ref >60)
Glucose, Bld: 161 mg/dL — ABNORMAL HIGH (ref 70–99)
Glucose, Bld: 142 mg/dL — ABNORMAL HIGH (ref 70–99)
Potassium: 5.1 mmol/L (ref 3.5–5.1)
Potassium: 4.9 mmol/L (ref 3.5–5.1)
Sodium: 143 mmol/L (ref 135–145)
Sodium: 144 mmol/L (ref 135–145)

## 2019-11-16 LAB — BLOOD CULTURE ID PANEL (REFLEXED) - BCID2

## 2019-11-16 LAB — CBC WITH DIFFERENTIAL/PLATELET
Abs Immature Granulocytes: 0 10*3/uL (ref 0.00–0.07)
Basophils Absolute: 0 10*3/uL (ref 0.0–0.1)
Basophils Relative: 0 %
Eosinophils Absolute: 0 10*3/uL (ref 0.0–0.5)
Eosinophils Relative: 0 %
HCT: 29.9 % — ABNORMAL LOW (ref 39.0–52.0)
Hemoglobin: 9.4 g/dL — ABNORMAL LOW (ref 13.0–17.0)
Lymphocytes Relative: 3 %
Lymphs Abs: 0.4 10*3/uL — ABNORMAL LOW (ref 0.7–4.0)
MCH: 32 pg (ref 26.0–34.0)
MCHC: 31.4 g/dL (ref 30.0–36.0)
MCV: 101.7 fL — ABNORMAL HIGH (ref 80.0–100.0)
Monocytes Absolute: 0.5 10*3/uL (ref 0.1–1.0)
Monocytes Relative: 4 %
Neutro Abs: 11.9 10*3/uL — ABNORMAL HIGH (ref 1.7–7.7)
Neutrophils Relative %: 93 %
Platelets: 262 10*3/uL (ref 150–400)
RBC: 2.94 MIL/uL — ABNORMAL LOW (ref 4.22–5.81)
RDW: 16.7 % — ABNORMAL HIGH (ref 11.5–15.5)
WBC: 12.8 10*3/uL — ABNORMAL HIGH (ref 4.0–10.5)
nRBC: 0 % (ref 0.0–0.2)
nRBC: 0 /100 WBC

## 2019-11-16 LAB — POCT I-STAT 7, (LYTES, BLD GAS, ICA,H+H)
Acid-base deficit: 7 mmol/L — ABNORMAL HIGH (ref 0.0–2.0)
Bicarbonate: 21.5 mmol/L (ref 20.0–28.0)
Calcium, Ion: 1.04 mmol/L — ABNORMAL LOW (ref 1.15–1.40)
HCT: 29 % — ABNORMAL LOW (ref 39.0–52.0)
Hemoglobin: 9.9 g/dL — ABNORMAL LOW (ref 13.0–17.0)
O2 Saturation: 81 %
Patient temperature: 100.2
Potassium: 5.2 mmol/L — ABNORMAL HIGH (ref 3.5–5.1)
Sodium: 144 mmol/L (ref 135–145)
TCO2: 23 mmol/L (ref 22–32)
pCO2 arterial: 63.3 mmHg — ABNORMAL HIGH (ref 32.0–48.0)
pH, Arterial: 7.144 — CL (ref 7.350–7.450)
pO2, Arterial: 63 mmHg — ABNORMAL LOW (ref 83.0–108.0)

## 2019-11-16 LAB — COMPREHENSIVE METABOLIC PANEL
ALT: 23 U/L (ref 0–44)
AST: 47 U/L — ABNORMAL HIGH (ref 15–41)
Albumin: 1.4 g/dL — ABNORMAL LOW (ref 3.5–5.0)
Alkaline Phosphatase: 79 U/L (ref 38–126)
Anion gap: 12 (ref 5–15)
BUN: 79 mg/dL — ABNORMAL HIGH (ref 8–23)
CO2: 21 mmol/L — ABNORMAL LOW (ref 22–32)
Calcium: 6.7 mg/dL — ABNORMAL LOW (ref 8.9–10.3)
Chloride: 112 mmol/L — ABNORMAL HIGH (ref 98–111)
Creatinine, Ser: 2.87 mg/dL — ABNORMAL HIGH (ref 0.61–1.24)
GFR, Estimated: 22 mL/min — ABNORMAL LOW (ref >60)
Glucose, Bld: 106 mg/dL — ABNORMAL HIGH (ref 70–99)
Potassium: 5.1 mmol/L (ref 3.5–5.1)
Sodium: 145 mmol/L (ref 135–145)
Total Bilirubin: 1 mg/dL (ref 0.3–1.2)
Total Protein: 5.1 g/dL — ABNORMAL LOW (ref 6.5–8.1)

## 2019-11-16 LAB — GLUCOSE, CAPILLARY
Glucose-Capillary: 98 mg/dL (ref 70–99)
Glucose-Capillary: 53 mg/dL — ABNORMAL LOW (ref 70–99)
Glucose-Capillary: 115 mg/dL — ABNORMAL HIGH (ref 70–99)
Glucose-Capillary: 99 mg/dL (ref 70–99)
Glucose-Capillary: 102 mg/dL — ABNORMAL HIGH (ref 70–99)
Glucose-Capillary: 111 mg/dL — ABNORMAL HIGH (ref 70–99)
Glucose-Capillary: 114 mg/dL — ABNORMAL HIGH (ref 70–99)

## 2019-11-16 LAB — CSF CELL COUNT WITH DIFFERENTIAL
RBC Count, CSF: 11 /mm3 — ABNORMAL HIGH
RBC Count, CSF: 577 /mm3 — ABNORMAL HIGH
Tube #: 1
Tube #: 4
WBC, CSF: 1 /mm3 (ref 0–5)
WBC, CSF: 1 /mm3 (ref 0–5)

## 2019-11-16 LAB — HEPARIN LEVEL (UNFRACTIONATED)
Heparin Unfractionated: 0.27 IU/mL — ABNORMAL LOW (ref 0.30–0.70)
Heparin Unfractionated: 0.28 IU/mL — ABNORMAL LOW (ref 0.30–0.70)

## 2019-11-16 LAB — CULTURE, BLOOD (ROUTINE X 2)
Culture: NO GROWTH
Special Requests: ADEQUATE

## 2019-11-16 LAB — PHOSPHORUS: Phosphorus: 5.9 mg/dL — ABNORMAL HIGH (ref 2.5–4.6)

## 2019-11-16 LAB — D-DIMER, QUANTITATIVE: D-Dimer, Quant: 5.77 ug/mL-FEU — ABNORMAL HIGH (ref 0.00–0.50)

## 2019-11-16 LAB — PROTEIN AND GLUCOSE, CSF
Glucose, CSF: 88 mg/dL — ABNORMAL HIGH (ref 40–70)
Total  Protein, CSF: 30 mg/dL (ref 15–45)

## 2019-11-16 LAB — TRIGLYCERIDES: Triglycerides: 90 mg/dL (ref <150)

## 2019-11-16 LAB — C-REACTIVE PROTEIN: CRP: 30.1 mg/dL — ABNORMAL HIGH (ref <1.0)

## 2019-11-16 LAB — FERRITIN: Ferritin: 423 ng/mL — ABNORMAL HIGH (ref 24–336)

## 2019-11-16 LAB — MAGNESIUM: Magnesium: 2.7 mg/dL — ABNORMAL HIGH (ref 1.7–2.4)

## 2019-11-16 LAB — CK: Total CK: 137 U/L (ref 49–397)

## 2019-11-16 MED ORDER — ONDANSETRON HCL 4 MG PO TABS: 4.0000 mg | ORAL_TABLET | Freq: Four times a day (QID) | ORAL | Status: DC | PRN

## 2019-11-16 MED ORDER — POLYETHYLENE GLYCOL 3350 17 G PO PACK
17.0000 g | PACK | Freq: Every day | ORAL | Status: DC | PRN
  Administered 2019-11-17: 17 g
  Filled 2019-11-16: qty 1

## 2019-11-16 MED ORDER — CALCIUM GLUCONATE-NACL 1-0.675 GM/50ML-% IV SOLN
1.0000 g | Freq: Once | INTRAVENOUS | Status: DC
  Filled 2019-11-16: qty 50

## 2019-11-16 MED ORDER — SODIUM CHLORIDE 0.9 % IV SOLN
2.0000 g | INTRAVENOUS | Status: DC
  Administered 2019-11-16: 2 g via INTRAVENOUS
  Filled 2019-11-16: qty 20

## 2019-11-16 MED ORDER — SODIUM CHLORIDE 0.9% FLUSH: 10.0000 mL | INTRAVENOUS | Status: DC | PRN

## 2019-11-16 MED ORDER — DEXTROSE 50 % IV SOLN: 12.5000 g | INTRAVENOUS | Status: AC

## 2019-11-16 MED ORDER — SODIUM BICARBONATE 8.4 % IV SOLN
100.0000 meq | Freq: Once | INTRAVENOUS | Status: AC
  Administered 2019-11-16: 100 meq via INTRAVENOUS
  Filled 2019-11-16: qty 100

## 2019-11-16 MED ORDER — CALCIUM GLUCONATE-NACL 2-0.675 GM/100ML-% IV SOLN
2.0000 g | Freq: Once | INTRAVENOUS | Status: AC
  Administered 2019-11-16: 2000 mg via INTRAVENOUS
  Filled 2019-11-16: qty 100

## 2019-11-16 MED ORDER — VECURONIUM BROMIDE 10 MG IV SOLR: 10.0000 mg | Freq: Once | INTRAVENOUS | Status: AC

## 2019-11-16 MED ORDER — SODIUM CHLORIDE 0.9 % IV SOLN
2.0000 g | Freq: Two times a day (BID) | INTRAVENOUS | Status: DC
  Administered 2019-11-16: 2 g via INTRAVENOUS
  Filled 2019-11-16: qty 20

## 2019-11-16 MED ORDER — SODIUM CHLORIDE 0.9% FLUSH
10.0000 mL | Freq: Two times a day (BID) | INTRAVENOUS | Status: DC
  Administered 2019-11-16 – 2019-11-18 (×4): 10 mL

## 2019-11-16 MED ORDER — DEXTROSE 50 % IV SOLN
INTRAVENOUS | Status: AC
  Administered 2019-11-16: 12.5 g via INTRAVENOUS
  Filled 2019-11-16: qty 50

## 2019-11-16 MED ORDER — HEPARIN (PORCINE) 25000 UT/250ML-% IV SOLN
2000.0000 [IU]/h | INTRAVENOUS | Status: DC
  Administered 2019-11-16 – 2019-11-18 (×3): 2000 [IU]/h via INTRAVENOUS
  Filled 2019-11-16 (×2): qty 250

## 2019-11-16 MED ORDER — VECURONIUM BROMIDE 10 MG IV SOLR
INTRAVENOUS | Status: AC
  Administered 2019-11-16: 10 mg
  Filled 2019-11-16: qty 10

## 2019-11-16 MED ORDER — STERILE WATER FOR INJECTION IV SOLN
INTRAVENOUS | Status: DC
  Filled 2019-11-16 (×5): qty 850

## 2019-11-16 MED ORDER — ONDANSETRON HCL 4 MG/2ML IJ SOLN: 4.0000 mg | Freq: Four times a day (QID) | INTRAMUSCULAR | Status: DC | PRN

## 2019-11-16 NOTE — Progress Notes (Signed)
eLink Physician-Brief Progress Note Patient Name: Charles Dawson DOB: [birth year] MRN: [id number]   Date of Service  11/16/2019  HPI/Events of Note  ABG on 50%/PRVC 30/TV 550/P 10 = 7.144/63.3/63. Sat = 95%  eICU Interventions  Plan: 1. Increase PRVC rate to 35. 2. NaHCO3 100 meq IV X 1 now.  4. NaHCO3 IV infusion to run IV at 100 mL/hour. 5. Repeat ABG at 5 AM.     Intervention Category Major Interventions: Acid-Base disturbance - evaluation and management;Respiratory failure - evaluation and management  [name] 11/16/2019, 1:00 AM

## 2019-11-16 NOTE — Progress Notes (Signed)
NAME:  Charles Dawson, MRN:  [id number], DOB:  [birth year], LOS: 5 ADMISSION DATE:  11/24/2019, CONSULTATION DATE: 11-18-2019 REFERRING MD: Internal medicine residency service, CHIEF COMPLAINT: Patient with COVID-19  Brief History   Patient is a 64 year old admitted yesterday for complaints of shortness of breath hypoxemia found to be Covid positive with COVID pneumonia by chest x-ray and clinical exam.  History of present illness   Patient is a 64 year old admitted yesterday for complaints of shortness of breath hypoxemia found to be Covid positive with COVID pneumonia by chest x-ray and clinical exam.  Patient was started on remdesivir and Decadron. I was asked to see the patient urgently due to desaturation.  The patient had earlier in the evening become combative.  He has a questionable history of heavy alcohol abuse.  He drinks about 4 cases of beer a week.  The general feeling at that time was that he was in alcohol withdrawal with worsening hypoxemia due to his Covid pneumonia.  On my evaluation the patient was extremely agitated pulling his oxygen off oxygen saturations in the mid 70s.  Is urgently transferred to the ICU and intubated.  During intubation his oxygen saturations remained in the 60s.  At the start of intubation his oxygen saturation was 70%.  After the ET tube was placed there was significant frothy red secretions.  Follow-up chest x-ray is still pending. Intubation the patient was placed on PEEP of 14, 100% oxygen saturation came back up into the 90s.  Past Medical History   Past Medical History:  Diagnosis Date  . Anxiety   . 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 Better sedated. Decreased vasopressor requirements following switch from  propofol to midazolam. Still relatively high norepinephrine requirement.  Now growing pneumococcus in blood cultures.  Objective   Blood pressure 118/60, pulse 81, temperature 99 F (37.2 C), resp. rate (!) 36, height 5\' 8"  (1.727 m), weight 85.7 kg, SpO2 97 %. CVP:  [7 mmHg-13 mmHg] 11 mmHg  Vent Mode: PRVC FiO2 (%):  [40 %-50 %] 40 % Set Rate:  [30 bmp-35 bmp] 35 bmp Vt Set:  [550 mL] 550 mL PEEP:  [8 cmH20-10 cmH20] 8 cmH20 Plateau Pressure:  [16 cmH20-28 cmH20] 24 cmH20   Intake/Output Summary (Last 24 hours) at 11/16/2019 1118 Last data filed at 11/16/2019 1000 Gross per 24 hour  Intake 5966.02 ml  Output 1245 ml  Net 4721.02 ml   Filed Weights   11/14/19 0500 11/15/19 0500 11/16/19 0500  Weight: 82.1 kg 82.1 kg 85.7 kg   CVP:  [7 mmHg-13 mmHg] 11 mmHg  Examination: General: Thin ill kempt man intubated. HENT: ET tube and OG tube in place Lungs: Clear anteriorly.  Less dyssynchrony than yesterday.   Cardiovascular: Extremities warm heart sounds are unremarkable. Abdomen: Benign bowel sounds positive abdomen is soft, tolerating tube feeds Extremities: No edema Neuro: Sedated no response to painful stimuli. GU: Catheter in place, minimal urine output  Resolved Hospital Problem list   NA  Assessment & Plan:  Critically ill due to hypoxemia secondary to Covid pneumonia/pneumococcal pneumonia.  -Wean PEEP to 8.  ST elevation MI-not amenable to percutaneous revascularization. -Start medical management: Aspirin, clopidogrel and rosuvastatin started. -Echocardiogram to assess LV function -Initiate beta-blocker, ACE inhibitor as blood pressure and heart rate allow. -Surgical assessment once  Covid resolves.  Multifactorial shock due to Staphylococcus pneumonia, COVID-19, and possible cardiogenic shock requiring titration of norepinephrine and epinephrine. -Appears volume replete continue to follow CVP -Wean norepinephrine to keep MAP greater than 65 -Continue  epinephrine at current dose until norepinephrine dose less than 15  Pneumococcal bacteremia with severe sepsis -Narrow antibiotics to ceftriaxone alone - Hold heparin for 2h and proceed with LP as his history is unreliable to exclude meningitis symptoms.   Possible alcohol withdrawal alcohol withdrawal, however may have been symptoms of myocardial infarction. -Continue enteral benzodiazepines, add Seroquel. -Once off IV propofol will start benzodiazepine taper.  Acute deep vein thrombosis involving the left femoral vein, and left gastrocnemius veins.  -Continue IV heparin.  Covid pneumonia -Continue remdesivir and Decadron - No biologics given bacteremia.   Acute kidney injury -slow worsening but no indications for dialysis at this time -Maintain euvolemia and normotension. -Follow kidney function -Development of severe renal failure requiring renal replacement were portend a poor prognosis. -Check CK and hold statin in face of rising creatinine.   Best practice:  Diet: Tolerating tube feeds, start bowel regimen. Pain/Anxiety/Delirium protocol (if indicated): midazolam and fentanyl. Target RASS -2, adding enteral sedatives. VAP protocol (if indicated): Yes DVT prophylaxis: IV heparin for active DVT GI prophylaxis: Pepcid Glucose control: Stress hyperglycemia well controlled on sliding scale insulin alone. Mobility: Bedrest Code Status: Full code Family Communication:  Brother Roe CoombsDon was updated 10/18.  Will update his brother Annette StableBill who has power of attorney (no answer initially) Disposition: ICU  Labs   CBC: Recent Labs  Lab 11/12/19 0134 11/12/19 0134 11/25/2019 0500 11/14/2019 0516 11/14/19 0500 11/14/19 1204 11/15/19 0415 11/15/19 0433 11/16/19 0015 11/16/19 0417 11/16/19 0506  WBC 7.2  --  19.2*  --  20.4*  --   --  12.7*  --  12.8*  --   NEUTROABS 5.5  --  16.4*  --  18.0*  --   --  10.8*  --  11.9*  --   HGB 11.9*   < > 15.2   < > 11.2*   < > 9.9* 10.4* 9.9* 9.4*  8.8*  HCT 34.6*   < > 45.5   < > 35.0*   < > 29.0* 32.9* 29.0* 29.9* 26.0*  MCV 93.5  --  94.2  --  99.4  --   --  99.7  --  101.7*  --   PLT 227  --  204  --  163  --   --  222  --  262  --    < > = values in this interval not displayed.    Basic Metabolic Panel: Recent Labs  Lab 11/12/19 0134 11/12/19 0134 11/01/2019 0500 11/22/2019 0516 11/14/19 0500 11/14/19 1204 11/15/19 0433 11/16/19 0015 11/16/19 0417 11/16/19 0506 11/16/19 0906  NA 133*   < > 137   < > 141   < > 143 144 145 143 143  K 3.8   < > 3.5   < > 4.8   < > 5.1 5.2* 5.1 7.0* 5.1  CL 99   < > 101  --  107  --  110  --  112*  --  110  CO2 24   < > 26  --  22  --  25  --  21*  --  22  GLUCOSE 131*   < > 132*  --  170*  --  120*  --  106*  --  161*  BUN 20   < >  20  --  40*  --  53*  --  79*  --  83*  CREATININE 0.73   < > 0.66  --  1.50*  --  1.87*  --  2.87*  --  2.88*  CALCIUM 7.8*   < > 7.9*  --  6.9*  --  7.3*  --  6.7*  --  6.9*  MG 2.1  --  2.1  --  2.4  --  2.6*  --  2.7*  --   --   PHOS 3.1  --  3.9  --  5.4*  --  4.4  --  5.9*  --   --    < > = values in this interval not displayed.   GFR: Estimated Creatinine Clearance: 26.9 mL/min (A) (by C-G formula based on SCr of 2.88 mg/dL (H)). Recent Labs  Lab 11-28-2019 1034 Nov 28, 2019 1655 11/12/19 0134 11/12/19 0134 11/12/19 0455 11/17/2019 0500 11/21/2019 0513 11/20/2019 1029 11/14/19 0500 11/15/19 0433 11/16/19 0417  PROCALCITON 0.56  --   --   --   --   --   --   --   --   --   --   WBC 12.6*   < > 7.2   < >  --  19.2*  --   --  20.4* 12.7* 12.8*  LATICACIDVEN 2.4*   < > 2.4*  --  2.2*  --  3.2* 3.0*  --   --   --    < > = values in this interval not displayed.    Liver Function Tests: Recent Labs  Lab 11/12/19 0134 11/08/2019 0500 11/14/19 0500 11/15/19 0433 11/16/19 0417  AST 61* 77* 75* 51* 47*  ALT 27 34 32 29 23  ALKPHOS 57 107 75 69 79  BILITOT 0.6 0.9 0.5 0.6 1.0  PROT 5.2* 5.6* 5.3* 5.1* 5.1*  ALBUMIN 2.0* 2.2* 1.7* 1.7* 1.4*   No results  for input(s): LIPASE, AMYLASE in the last 168 hours. No results for input(s): AMMONIA in the last 168 hours.  ABG    Component Value Date/Time   PHART 7.262 (L) 11/16/2019 0506   PCO2ART 55.2 (H) 11/16/2019 0506   PO2ART 79 (L) 11/16/2019 0506   HCO3 24.7 11/16/2019 0506   TCO2 26 11/16/2019 0506   ACIDBASEDEF 2.0 11/16/2019 0506   O2SAT 92.0 11/16/2019 0506     Coagulation Profile: Recent Labs  Lab 10/29/2019 0500  INR 1.5*   HbA1C: No results found for: HGBA1C  CBG: Recent Labs  Lab 11/15/19 2328 11/16/19 0343 11/16/19 0759 11/16/19 0833 11/16/19 1102  GLUCAP 82 98 53* 115* 99   Cardiac Panel (last 3 results) No results for input(s): CKTOTAL, CKMB, TROPONINIHS, RELINDX in the last 72 hours. CRITICAL CARE Performed by: Lynnell Catalan   Total critical care time: 45 minutes  Critical care time was exclusive of separately billable procedures and treating other patients.  Critical care was necessary to treat or prevent imminent or life-threatening deterioration.  Critical care was time spent personally by me on the following activities: development of treatment plan with patient and/or surrogate as well as nursing, discussions with consultants, evaluation of patient's response to treatment, examination of patient, obtaining history from patient or surrogate, ordering and performing treatments and interventions, ordering and review of laboratory studies, ordering and review of radiographic studies, pulse oximetry, re-evaluation of patient's condition and participation in multidisciplinary rounds.  Lynnell Catalan, MD [facility] ICU Physician [facility] [place] Critical [facility]  Pager: [phone] Mobile: [phone]  After hours: [phone].

## 2019-11-16 NOTE — Progress Notes (Signed)
Inpatient Diabetes Program Recommendations  AACE/ADA: New Consensus Statement on Inpatient Glycemic Control (2015)  Target Ranges:  Prepandial:   less than 140 mg/dL      Peak postprandial:   less than 180 mg/dL (1-2 hours)      Critically ill patients:  140 - 180 mg/dL   Lab Results  Component Value Date   GLUCAP 99 11/16/2019    Review of Glycemic Control  Results for FAITH, BRANAN (MRN [id number]) as of 11/16/2019 13:53  Ref. Range 11/15/2019 03:57 11/15/2019 07:35 11/15/2019 11:25 11/15/2019 16:18 11/15/2019 19:29 11/15/2019 23:28 11/16/2019 03:43 11/16/2019 07:59 11/16/2019 08:33 11/16/2019 11:02  Glucose-Capillary Latest Ref Range: 70 - 99 mg/dL 921 (H) 194 (H) 174 (H) 97 96 82 98 53 (L) 115 (H) 99   Diabetes history: None Current orders for Inpatient glycemic control:  ICU glycemic control order set: Novolog 2-4-6 q 4 hours  Inpatient Diabetes Program Recommendations:    Patient does not seem to need Novolog.  Blood sugars <100 mg/dL.  Consider d/c of Novolog insulin.   Thanks,  Beryl Meager, RN, BC-ADM Inpatient Diabetes Coordinator Pager [phone] (8a-5p)

## 2019-11-16 NOTE — Progress Notes (Signed)
ANTICOAGULATION CONSULT NOTE - Follow Up Consult  Pharmacy Consult for heparin Indication: DVT and CAD  Labs: Recent Labs    23-Nov-2019 1029 2019-11-23 1054 11/14/19 0500 11/14/19 0510 11/14/19 2119 11/15/19 0415 11/15/19 0433 11/15/19 0433 11/16/19 0015 11/16/19 0015 11/16/19 0417 11/16/19 0506  HGB  --    < > 11.2*   < >  --    < > 10.4*   < > 9.9*   < > 9.4* 8.8*  HCT  --    < > 35.0*   < >  --    < > 32.9*   < > 29.0*  --  29.9* 26.0*  PLT  --   --  163  --   --   --  222  --   --   --  262  --   HEPARINUNFRC  --    < >  --    < > 0.36  --  0.50  --   --   --  0.27*  --   CREATININE  --   --  1.50*  --   --   --  1.87*  --   --   --  2.87*  --   TROPONINIHS 2,953*  --   --   --   --   --   --   --   --   --   --   --    < > = values in this interval not displayed.    Assessment: 64yo male subtherapeutic on heparin after two levels at goal; no gtt issues or signs of bleeding per RN. Noted Hgb 10.4>9.4>8.8.   Goal of Therapy:  Heparin level 0.3-0.7 units/ml   Plan:  Will increase heparin gtt by 10% to 2000 units/hr and check level in 8 hours; monitor for signs of bleeding.  Vernard Gambles, PharmD, BCPS  11/16/2019,6:00 AM

## 2019-11-16 NOTE — Procedures (Signed)
Lumbar Puncture Procedure Note  Charles Dawson  [id number]  October 05, 1955  Date:11/16/19  Time:5:54 PM   Provider Performing:Kaetlyn Noa   Procedure: Lumbar Puncture (29021)  Indication(s) Rule out meningitis  Consent Unable to obtain consent due to emergent nature of procedure.  Anesthesia Topical only with 1% lidocaine    Time Out Verified patient identification, verified procedure, site/side was marked, verified correct patient position, special equipment/implants available, medications/allergies/relevant history reviewed, required imaging and test results available.   Sterile Technique Maximal sterile technique including sterile barrier drape, hand hygiene, sterile gown, sterile gloves, mask, hair covering.    Procedure Description Using palpation, approximate location of L3-L4 space identified.   Lidocaine used to anesthetize skin and subcutaneous tissue overlying this area.  A 20g spinal needle was then used to access the subarachnoid space. Opening pressure:Not obtained. Closing pressure:Not obtained. 10 ML CSF obtained.  Complications/Tolerance None; patient tolerated the procedure well.   EBL Minimal   Specimen(s) CSF

## 2019-11-16 NOTE — Progress Notes (Addendum)
PHARMACY - PHYSICIAN COMMUNICATION CRITICAL VALUE ALERT - BLOOD CULTURE IDENTIFICATION (BCID)  Charles Dawson is an 64 y.o. male who presented to [facility] on 11/24/2019 with a chief complaint of shortness of breath, hypoxemia, and COVID-19 positive  Assessment:  Blood cultures with gram positive cocci in pairs and chains in all 4 bottles. BCID showing strep pneumo.  Name of physician (or Provider) Contacted: Garvin Fila, PharmD (will communicate with CCM)  Current antibiotics: vancomycin and cefepime. Recommend discontinuing vancomycin and switching cefepime to ceftriaxone 2g QD based on our BCID protocol.  Changes to prescribed antibiotics recommended:  Recommendation accepted by provider  Results for orders placed or performed during the hospital encounter of 11/04/2019  Blood Culture ID Panel (Reflexed) (Collected: 11/15/2019  4:22 PM)  Result Value Ref Range   Enterococcus faecalis NOT DETECTED NOT DETECTED   Enterococcus Faecium NOT DETECTED NOT DETECTED   Listeria monocytogenes NOT DETECTED NOT DETECTED   Staphylococcus species NOT DETECTED NOT DETECTED   Staphylococcus aureus (BCID) NOT DETECTED NOT DETECTED   Staphylococcus epidermidis NOT DETECTED NOT DETECTED   Staphylococcus lugdunensis NOT DETECTED NOT DETECTED   Streptococcus species DETECTED (A) NOT DETECTED   Streptococcus agalactiae NOT DETECTED NOT DETECTED   Streptococcus pneumoniae DETECTED (A) NOT DETECTED   Streptococcus pyogenes NOT DETECTED NOT DETECTED   A.calcoaceticus-baumannii NOT DETECTED NOT DETECTED   Bacteroides fragilis NOT DETECTED NOT DETECTED   Enterobacterales NOT DETECTED NOT DETECTED   Enterobacter cloacae complex NOT DETECTED NOT DETECTED   Escherichia coli NOT DETECTED NOT DETECTED   Klebsiella aerogenes NOT DETECTED NOT DETECTED   Klebsiella oxytoca NOT DETECTED NOT DETECTED   Klebsiella pneumoniae NOT DETECTED NOT DETECTED   Proteus species NOT DETECTED NOT DETECTED   Salmonella species  NOT DETECTED NOT DETECTED   Serratia marcescens NOT DETECTED NOT DETECTED   Haemophilus influenzae NOT DETECTED NOT DETECTED   Neisseria meningitidis NOT DETECTED NOT DETECTED   Pseudomonas aeruginosa NOT DETECTED NOT DETECTED   Stenotrophomonas maltophilia NOT DETECTED NOT DETECTED   Candida albicans NOT DETECTED NOT DETECTED   Candida auris NOT DETECTED NOT DETECTED   Candida glabrata NOT DETECTED NOT DETECTED   Candida krusei NOT DETECTED NOT DETECTED   Candida parapsilosis NOT DETECTED NOT DETECTED   Candida tropicalis NOT DETECTED NOT DETECTED   Cryptococcus neoformans/gattii NOT DETECTED NOT DETECTED    Rexford Maus, PharmD PGY-1 Acute Care Pharmacy Resident Office: [phone] 11/16/2019 8:37 AM

## 2019-11-16 NOTE — Progress Notes (Signed)
Chart reviewed.

## 2019-11-17 DIAGNOSIS — U071 COVID-19: Secondary | ICD-10-CM | POA: Diagnosis not present

## 2019-11-17 DIAGNOSIS — E43 Unspecified severe protein-calorie malnutrition: Secondary | ICD-10-CM | POA: Insufficient documentation

## 2019-11-17 DIAGNOSIS — J1282 Pneumonia due to coronavirus disease 2019: Secondary | ICD-10-CM | POA: Diagnosis not present

## 2019-11-17 LAB — POCT I-STAT 7, (LYTES, BLD GAS, ICA,H+H)
Acid-Base Excess: 0 mmol/L (ref 0.0–2.0)
Bicarbonate: 25.2 mmol/L (ref 20.0–28.0)
Calcium, Ion: 0.96 mmol/L — ABNORMAL LOW (ref 1.15–1.40)
HCT: 20 % — ABNORMAL LOW (ref 39.0–52.0)
Hemoglobin: 6.8 g/dL — CL (ref 13.0–17.0)
O2 Saturation: 92 %
Patient temperature: 97
Potassium: 4.9 mmol/L (ref 3.5–5.1)
Sodium: 145 mmol/L (ref 135–145)
TCO2: 27 mmol/L (ref 22–32)
pCO2 arterial: 43.6 mmHg (ref 32.0–48.0)
pH, Arterial: 7.367 (ref 7.350–7.450)
pO2, Arterial: 64 mmHg — ABNORMAL LOW (ref 83.0–108.0)

## 2019-11-17 LAB — CBC
HCT: 23.7 % — ABNORMAL LOW (ref 39.0–52.0)
Hemoglobin: 7.6 g/dL — ABNORMAL LOW (ref 13.0–17.0)
MCH: 31.4 pg (ref 26.0–34.0)
MCHC: 32.1 g/dL (ref 30.0–36.0)
MCV: 97.9 fL (ref 80.0–100.0)
Platelets: 199 10*3/uL (ref 150–400)
RBC: 2.42 MIL/uL — ABNORMAL LOW (ref 4.22–5.81)
RDW: 16.9 % — ABNORMAL HIGH (ref 11.5–15.5)
WBC: 10.3 10*3/uL (ref 4.0–10.5)
nRBC: 0 % (ref 0.0–0.2)

## 2019-11-17 LAB — HEPARIN LEVEL (UNFRACTIONATED): Heparin Unfractionated: 0.4 IU/mL (ref 0.30–0.70)

## 2019-11-17 LAB — GLUCOSE, CAPILLARY
Glucose-Capillary: 115 mg/dL — ABNORMAL HIGH (ref 70–99)
Glucose-Capillary: 108 mg/dL — ABNORMAL HIGH (ref 70–99)
Glucose-Capillary: 92 mg/dL (ref 70–99)
Glucose-Capillary: 72 mg/dL (ref 70–99)
Glucose-Capillary: 81 mg/dL (ref 70–99)
Glucose-Capillary: 99 mg/dL (ref 70–99)

## 2019-11-17 LAB — PHOSPHORUS: Phosphorus: 5.9 mg/dL — ABNORMAL HIGH (ref 2.5–4.6)

## 2019-11-17 LAB — BASIC METABOLIC PANEL
Anion gap: 14 (ref 5–15)
BUN: 108 mg/dL — ABNORMAL HIGH (ref 8–23)
CO2: 23 mmol/L (ref 22–32)
Calcium: 6.7 mg/dL — ABNORMAL LOW (ref 8.9–10.3)
Chloride: 108 mmol/L (ref 98–111)
Creatinine, Ser: 3.3 mg/dL — ABNORMAL HIGH (ref 0.61–1.24)
GFR, Estimated: 20 mL/min — ABNORMAL LOW (ref >60)
Glucose, Bld: 114 mg/dL — ABNORMAL HIGH (ref 70–99)
Potassium: 4.9 mmol/L (ref 3.5–5.1)
Sodium: 145 mmol/L (ref 135–145)

## 2019-11-17 LAB — HEMOGLOBIN AND HEMATOCRIT, BLOOD
HCT: 24 % — ABNORMAL LOW (ref 39.0–52.0)
Hemoglobin: 7.8 g/dL — ABNORMAL LOW (ref 13.0–17.0)

## 2019-11-17 LAB — MAGNESIUM: Magnesium: 2.8 mg/dL — ABNORMAL HIGH (ref 1.7–2.4)

## 2019-11-17 MED ORDER — CLONAZEPAM 1 MG PO TABS
2.0000 mg | ORAL_TABLET | Freq: Two times a day (BID) | ORAL | Status: DC
  Administered 2019-11-17 – 2019-11-19 (×5): 2 mg
  Filled 2019-11-17 (×5): qty 2

## 2019-11-17 MED ORDER — VITAL 1.5 CAL PO LIQD
1000.0000 mL | ORAL | Status: DC
  Administered 2019-11-17 – 2019-11-19 (×2): 1000 mL
  Filled 2019-11-17 (×3): qty 1000

## 2019-11-17 MED ORDER — FENTANYL BOLUS VIA INFUSION
100.0000 ug | Freq: Once | INTRAVENOUS | Status: AC
  Administered 2019-11-17: 100 ug via INTRAVENOUS

## 2019-11-17 MED ORDER — PROSOURCE TF PO LIQD
90.0000 mL | Freq: Two times a day (BID) | ORAL | Status: DC
  Administered 2019-11-17 – 2019-11-19 (×4): 90 mL
  Filled 2019-11-17 (×4): qty 90

## 2019-11-17 MED ORDER — OXYCODONE HCL 5 MG PO TABS
10.0000 mg | ORAL_TABLET | Freq: Four times a day (QID) | ORAL | Status: DC
  Administered 2019-11-17 – 2019-11-19 (×10): 10 mg
  Filled 2019-11-17 (×10): qty 2

## 2019-11-17 MED ORDER — FUROSEMIDE 10 MG/ML IJ SOLN
100.0000 mg | Freq: Once | INTRAVENOUS | Status: AC
  Administered 2019-11-17: 100 mg via INTRAVENOUS
  Filled 2019-11-17: qty 10

## 2019-11-17 MED ORDER — GLYCERIN (LAXATIVE) 2.1 G RE SUPP
1.0000 | Freq: Once | RECTAL | Status: AC
  Administered 2019-11-17: 1 via RECTAL
  Filled 2019-11-17: qty 1

## 2019-11-17 MED ORDER — SODIUM CHLORIDE 0.9 % IV SOLN
2.0000 g | INTRAVENOUS | Status: DC
  Administered 2019-11-17 – 2019-11-18 (×2): 2 g via INTRAVENOUS
  Filled 2019-11-17 (×2): qty 20

## 2019-11-17 MED ORDER — ATORVASTATIN CALCIUM 80 MG PO TABS
80.0000 mg | ORAL_TABLET | Freq: Every day | ORAL | Status: DC
  Administered 2019-11-17 – 2019-11-19 (×3): 80 mg
  Filled 2019-11-17 (×3): qty 1

## 2019-11-17 NOTE — Progress Notes (Signed)
ANTICOAGULATION CONSULT NOTE   Pharmacy Consult for heparin Indication: BLE DVT  Assessment: 64yo male had been admitted 10/15 for Covid PNA, decompensated overnight and was tx'd to the ICU and intubated, ECG reveals STEMI, to start heparin while awaiting transfer to the cath lab. Patient went to cath lab, pharmacy was reconsulted to start heparin 8 hours after sheath pulled.  [place] 10/17 revealed BLE DVTs. Heparin was held for~10 hours for lumbar puncture 10/20. Was restarted at previous rate of 2000 units/hr.  Hg 7.6, Hct 23.7 (trending down, will monitor) Scr 3.09, trending up No s/sx bleeding noted.  Heparin level is at goal (0.4).   Goal of Therapy:  Heparin level 0.3-0.7 units/ml Monitor platelets by anticoagulation protocol: Yes   Plan:  Continue heparin drip at 2000 units/ml Daily HL and CBC Monitor for s/sx of bleeding  Thanks for allowing pharmacy to be a part of this patient's care.  Arletha Pili, PharmD Candidate 11/17/2019 6:48 AM

## 2019-11-17 NOTE — Progress Notes (Signed)
Chart reviewed.

## 2019-11-17 NOTE — Progress Notes (Signed)
NAME:  Charles Dawson, MRN:  [id number], DOB:  04/02/1955, LOS: 6 ADMISSION DATE:  04/08/2019, CONSULTATION DATE: 11/08/2019 REFERRING MD: Internal medicine residency service, CHIEF COMPLAINT: Patient with COVID-19  Brief History   Patient is a 64 year old admitted yesterday for complaints of shortness of breath hypoxemia found to be Covid positive with COVID pneumonia by chest x-ray and clinical exam.  History of present illness   Patient is a 64 year old admitted yesterday for complaints of shortness of breath hypoxemia found to be Covid positive with COVID pneumonia by chest x-ray and clinical exam.  Patient was started on remdesivir and Decadron. I was asked to see the patient urgently due to desaturation.  The patient had earlier in the evening become combative.  He has a questionable history of heavy alcohol abuse.  He drinks about 4 cases of beer a week.  The general feeling at that time was that he was in alcohol withdrawal with worsening hypoxemia due to his Covid pneumonia.  On my evaluation the patient was extremely agitated pulling his oxygen off oxygen saturations in the mid 70s.  Is urgently transferred to the ICU and intubated.  During intubation his oxygen saturations remained in the 60s.  At the start of intubation his oxygen saturation was 70%.  After the ET tube was placed there was significant frothy red secretions.  Follow-up chest x-ray is still pending. Intubation the patient was placed on PEEP of 14, 100% oxygen saturation came back up into the 90s.  Past Medical History   Past Medical History:  Diagnosis Date  . Anxiety   . 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 Better sedated. Decreased vasopressor requirements following switch from  propofol to midazolam. Still relatively high norepinephrine requirement. Now growing pneumococcus in blood cultures.  Has not tolerated weaning of PEEP below 8.  Associated desaturation.  Occasional periods of dyssynchrony also not well-tolerated.  Objective   Blood pressure (!) 107/56, pulse 75, temperature (!) 97.3 F (36.3 C), resp. rate (!) 35, height 5\' 8"  (1.727 m), weight 89.7 kg, SpO2 95 %. CVP:  [10 mmHg-27 mmHg] 10 mmHg  Vent Mode: PRVC FiO2 (%):  [40 %] 40 % Set Rate:  [35 bmp] 35 bmp Vt Set:  [550 mL] 550 mL PEEP:  [8 cmH20] 8 cmH20 Plateau Pressure:  [22 cmH20-26 cmH20] 23 cmH20   Intake/Output Summary (Last 24 hours) at 11/17/2019 1021 Last data filed at 11/17/2019 0800 Gross per 24 hour  Intake 5059.47 ml  Output 1490 ml  Net 3569.47 ml   Filed Weights   11/15/19 0500 11/16/19 0500 11/17/19 0500  Weight: 82.1 kg 85.7 kg 89.7 kg   CVP:  [10 mmHg-27 mmHg] 10 mmHg  Examination: General: Thin ill kempt man intubated. HENT: ET tube and OG tube in place Lungs: Clear anteriorly.  Occasional dyssynchrony. Cardiovascular: Extremities warm heart sounds are unremarkable. Abdomen: Benign bowel sounds positive abdomen is soft, tolerating tube feeds Extremities: No edema Neuro: Sedated no response to painful stimuli. GU: Catheter in place, minimal urine output  Resolved Hospital Problem list   NA  Assessment & Plan:  Critically ill due to hypoxemia secondary to Covid pneumonia/pneumococcal pneumonia.  -Continue current lung protective strategy. -Does not appear ready to wean PEEP further.  ST elevation MI-not amenable to percutaneous revascularization. -Start medical management: Aspirin,  clopidogrel and rosuvastatin started. -Echocardiogram to assess LV function -Initiate beta-blocker, ACE inhibitor as blood pressure and heart rate allow. -Surgical assessment once Covid resolves.  Multifactorial shock due to Staphylococcus pneumonia, COVID-19, and possible  cardiogenic shock requiring titration of norepinephrine and epinephrine. -Appears volume replete continue to follow CVP -Wean norepinephrine to keep MAP greater than 65 -Wean vasopressin off -Epinephrine is off.  Pneumococcal bacteremia with severe sepsis.  CSF not consistent with meningitis. -Narrowed antibiotics to ceftriaxone alone  Possible alcohol withdrawal alcohol withdrawal, however may have been symptoms of myocardial infarction. -Increase enteral benzodiazepines, Seroquel, and oxycodone. -Continue to wean sedative infusions slowly  Acute deep vein thrombosis involving the left femoral vein, and left gastrocnemius veins.  -Continue IV heparin.  Covid pneumonia -Continue remdesivir and Decadron - No biologics given bacteremia.   Acute kidney injury - slow worsening but no indications for dialysis at this time -Maintain euvolemia and normotension. -Follow kidney function -Development of severe renal failure requiring renal replacement were portend a poor prognosis. -Trial of diuresis.  Best practice:  Diet: Tolerating tube feeds, start bowel regimen. Pain/Anxiety/Delirium protocol (if indicated): midazolam and fentanyl. Target RASS -2, adding enteral sedatives. VAP protocol (if indicated): Yes DVT prophylaxis: IV heparin for active DVT GI prophylaxis: Pepcid Glucose control: Stress hyperglycemia well controlled on sliding scale insulin alone. Mobility: Bedrest Code Status: Full code Family Communication: Updated patient's nephew, Annette Stable, on 10/20.  He was informed of his uncles critical condition with multiple medical issues.  He also endorsed that his uncle was not in the best of health and abuse drugs at baseline.  I indicated to him that further worsening and in particular the development of renal failure requiring dialysis would likely represent a significant negative prognostic factor and that goals of care might need to be readdressed at that time. Disposition:  ICU  Labs   CBC: Recent Labs  Lab 11/12/19 0134 11/12/19 0134 12-Dec-2019 0500 12/12/19 0516 11/14/19 0500 11/14/19 1204 11/15/19 0433 11/15/19 0433 11/16/19 0015 11/16/19 0417 11/16/19 0506 11/17/19 0434 11/17/19 0445  WBC 7.2   < > 19.2*  --  20.4*  --  12.7*  --   --  12.8*  --   --  10.3  NEUTROABS 5.5  --  16.4*  --  18.0*  --  10.8*  --   --  11.9*  --   --   --   HGB 11.9*   < > 15.2   < > 11.2*   < > 10.4*   < > 9.9* 9.4* 8.8* 6.8* 7.6*  HCT 34.6*   < > 45.5   < > 35.0*   < > 32.9*   < > 29.0* 29.9* 26.0* 20.0* 23.7*  MCV 93.5   < > 94.2  --  99.4  --  99.7  --   --  101.7*  --   --  97.9  PLT 227   < > 204  --  163  --  222  --   --  262  --   --  199   < > = values in this interval not displayed.    Basic Metabolic Panel: Recent Labs  Lab 11/12/19 0134 11/12/19 0134 2019-12-12 0500 Dec 12, 2019 0516 11/14/19 0500 11/14/19 1204 11/15/19 0433 11/16/19 0015 11/16/19 0417 11/16/19 0506 11/16/19 0906 11/16/19 1730 11/17/19 0434  NA 133*   < > 137   < > 141   < > 143   < > 145 143 143 144 145  K 3.8   < > 3.5   < > 4.8   < > 5.1   < > 5.1 7.0* 5.1 4.9 4.9  CL 99   < > 101   < > 107  --  110  --  112*  --  110 110  --   CO2 24   < > 26   < > 22  --  25  --  21*  --  22 24  --   GLUCOSE 131*   < > 132*   < > 170*  --  120*  --  106*  --  161* 142*  --   BUN 20   < > 20   < > 40*  --  53*  --  79*  --  83* 93*  --   CREATININE 0.73   < > 0.66   < > 1.50*  --  1.87*  --  2.87*  --  2.88* 3.09*  --   CALCIUM 7.8*   < > 7.9*   < > 6.9*  --  7.3*  --  6.7*  --  6.9* 6.7*  --   MG 2.1  --  2.1  --  2.4  --  2.6*  --  2.7*  --   --   --   --   PHOS 3.1  --  3.9  --  5.4*  --  4.4  --  5.9*  --   --   --   --    < > = values in this interval not displayed.   GFR: Estimated Creatinine Clearance: 25.6 mL/min (A) (by C-G formula based on SCr of 3.09 mg/dL (H)). Recent Labs  Lab 10/31/2019 1034 11/27/2019 1655 11/12/19 0134 11/12/19 0455 10/30/2019 0500 11/05/2019 0513  11/21/2019 1029 11/14/19 0500 11/15/19 0433 11/16/19 0417 11/17/19 0445  PROCALCITON 0.56  --   --   --   --   --   --   --   --   --   --   WBC 12.6*   < > 7.2  --    < >  --   --  20.4* 12.7* 12.8* 10.3  LATICACIDVEN 2.4*   < > 2.4* 2.2*  --  3.2* 3.0*  --   --   --   --    < > = values in this interval not displayed.    Liver Function Tests: Recent Labs  Lab 11/12/19 0134 11/09/2019 0500 11/14/19 0500 11/15/19 0433 11/16/19 0417  AST 61* 77* 75* 51* 47*  ALT 27 34 32 29 23  ALKPHOS 57 107 75 69 79  BILITOT 0.6 0.9 0.5 0.6 1.0  PROT 5.2* 5.6* 5.3* 5.1* 5.1*  ALBUMIN 2.0* 2.2* 1.7* 1.7* 1.4*   No results for input(s): LIPASE, AMYLASE in the last 168 hours. No results for input(s): AMMONIA in the last 168 hours.  ABG    Component Value Date/Time   PHART 7.367 11/17/2019 0434   PCO2ART 43.6 11/17/2019 0434   PO2ART 64 (L) 11/17/2019 0434   HCO3 25.2 11/17/2019 0434   TCO2 27 11/17/2019 0434   ACIDBASEDEF 2.0 11/16/2019 0506   O2SAT 92.0 11/17/2019 0434     Coagulation Profile: Recent Labs  Lab 11/01/2019 0500  INR 1.5*   HbA1C: No results found for: HGBA1C  CBG: Recent Labs  Lab 11/16/19 1634 11/16/19 1938 11/16/19 2322 11/17/19 0355 11/17/19 0738  GLUCAP 102* 111* 114* 115* 108*   Cardiac Panel (  last 3 results) Recent Labs    11/16/19 1230  CKTOTAL 137   CRITICAL CARE Performed by: Lynnell Catalan   Total critical care time: 40 minutes  Critical care time was exclusive of separately billable procedures and treating other patients.  Critical care was necessary to treat or prevent imminent or life-threatening deterioration.  Critical care was time spent personally by me on the following activities: development of treatment plan with patient and/or surrogate as well as nursing, discussions with consultants, evaluation of patient's response to treatment, examination of patient, obtaining history from patient or surrogate, ordering and performing  treatments and interventions, ordering and review of laboratory studies, ordering and review of radiographic studies, pulse oximetry, re-evaluation of patient's condition and participation in multidisciplinary rounds.  Lynnell Catalan, MD [facility] ICU Physician [facility]  Critical Care  Pager: [phone] Mobile: [phone] After hours: [phone].

## 2019-11-17 NOTE — Progress Notes (Signed)
eLink Physician-Brief Progress Note Patient Name: Charles Dawson DOB: [birth year] MRN: [id number]   Date of Service  11/17/2019  HPI/Events of Note  Hgb = 9.4 --> 7.6. Nursing request for follow up H/H.  eICU Interventions  Plan: 1. H/H at 12 noon.     Intervention Category Major Interventions: Other:  Lenell Antu 11/17/2019, 5:47 AM

## 2019-11-17 NOTE — Progress Notes (Signed)
Nutrition Follow-up  DOCUMENTATION CODES:   Severe malnutrition in context of social or environmental circumstances  INTERVENTION:   Initiate tube feeding via OG tube:  Vital 1.5 at 55 ml/h (1320 ml per day) Prosource TF 90 ml BID  Provides 2140 kcal, 133 gm protein, 1005 ml free water daily  Recommend increase bowel regimen due to lack of BM since admission.   NUTRITION DIAGNOSIS:   Severe Malnutrition related to social / environmental circumstances (smoking/ETOH/drug abuse) as evidenced by severe fat depletion, severe muscle depletion. Ongoing.   GOAL:   Patient will meet greater than or equal to 90% of their needs Met with TF.   MONITOR:   TF tolerance, Vent status  REASON FOR ASSESSMENT:   Malnutrition Screening Tool, Ventilator, Consult Enteral/tube feeding initiation and management  ASSESSMENT:   64 year old male who presented to the ED on 10/15 with SOB. PMH of tobacco use, EtOH abuse. Pt found to have COVID-19 pneumonia.  Pt unable to answer any questions, pt sedated on ventilator support.  Noted pt meets criteria for severe malnutrition, likely in setting of social circumstances with hx of ETOH/tobacco/drug abuse per family. Pressors weaning off.  No BM since admission.   Patient is currently intubated on ventilator support MV: 19 L/min Temp (24hrs), Avg:97.7 F (36.5 C), Min:96.8 F (36 C), Max:98.6 F (37 C)   Medications reviewed and include: decadron, folic acid, MVI with minerals, senokot-s, thiamine Fentanyl Versed Levophed @ 8 mcg  Vasopressin @ .03 units   Labs reviewed: BUN: 108, Cr: 6.7, PO4: 5.9, magnesium: 2.8, Na 145 16 F OG tube - tip in proximal gastric body   Current TF:  Vital High Protein @ 25 ml/hr with 90 ml ProSource TF TID Provides: 840 kcal and 118 grams protein   NUTRITION - FOCUSED PHYSICAL EXAM:    Most Recent Value  Orbital Region Severe depletion  Upper Arm Region Severe depletion  Thoracic and Lumbar Region  Severe depletion  Buccal Region Severe depletion  Temple Region Severe depletion  Clavicle Bone Region Severe depletion  Clavicle and Acromion Bone Region Severe depletion  Scapular Bone Region Unable to assess  Dorsal Hand Unable to assess  Patellar Region Unable to assess  Anterior Thigh Region Unable to assess  Posterior Calf Region Unable to assess  Edema (RD Assessment) Moderate  Hair Reviewed  Eyes Unable to assess  Mouth Unable to assess  Skin Reviewed  Nails Reviewed       Diet Order:   Diet Order            Diet NPO time specified  Diet effective now                 EDUCATION NEEDS:   No education needs have been identified at this time  Skin:  Skin Assessment: Reviewed RN Assessment  Last BM:  11/12/2019  Height:   Ht Readings from Last 1 Encounters:  11/03/2019 5' 8" (1.727 m)    Weight:   Wt Readings from Last 1 Encounters:  11/17/19 89.7 kg    Ideal Body Weight:     BMI:  Body mass index is 30.07 kg/m.  Estimated Nutritional Needs:   Kcal:  1800-2500  Protein:  120-135 grams  Fluid:  >/= 2 L/day  Lockie Pares., RD, LDN, CNSC See AMiON for contact information

## 2019-11-18 DIAGNOSIS — J9601 Acute respiratory failure with hypoxia: Secondary | ICD-10-CM

## 2019-11-18 DIAGNOSIS — U071 COVID-19: Secondary | ICD-10-CM

## 2019-11-18 LAB — CULTURE, RESPIRATORY W GRAM STAIN

## 2019-11-18 LAB — GLUCOSE, CAPILLARY
Glucose-Capillary: 112 mg/dL — ABNORMAL HIGH (ref 70–99)
Glucose-Capillary: 139 mg/dL — ABNORMAL HIGH (ref 70–99)
Glucose-Capillary: 152 mg/dL — ABNORMAL HIGH (ref 70–99)
Glucose-Capillary: 155 mg/dL — ABNORMAL HIGH (ref 70–99)
Glucose-Capillary: 155 mg/dL — ABNORMAL HIGH (ref 70–99)
Glucose-Capillary: 172 mg/dL — ABNORMAL HIGH (ref 70–99)

## 2019-11-18 LAB — POCT I-STAT 7, (LYTES, BLD GAS, ICA,H+H)
Acid-Base Excess: 3 mmol/L — ABNORMAL HIGH (ref 0.0–2.0)
Bicarbonate: 29.1 mmol/L — ABNORMAL HIGH (ref 20.0–28.0)
Calcium, Ion: 0.96 mmol/L — ABNORMAL LOW (ref 1.15–1.40)
HCT: 21 % — ABNORMAL LOW (ref 39.0–52.0)
Hemoglobin: 7.1 g/dL — ABNORMAL LOW (ref 13.0–17.0)
O2 Saturation: 91 %
Patient temperature: 99.3
Potassium: 5.4 mmol/L — ABNORMAL HIGH (ref 3.5–5.1)
Sodium: 147 mmol/L — ABNORMAL HIGH (ref 135–145)
TCO2: 31 mmol/L (ref 22–32)
pCO2 arterial: 57.5 mmHg — ABNORMAL HIGH (ref 32.0–48.0)
pH, Arterial: 7.314 — ABNORMAL LOW (ref 7.350–7.450)
pO2, Arterial: 69 mmHg — ABNORMAL LOW (ref 83.0–108.0)

## 2019-11-18 LAB — HEPARIN LEVEL (UNFRACTIONATED)
Heparin Unfractionated: 1.14 IU/mL — ABNORMAL HIGH (ref 0.30–0.70)
Heparin Unfractionated: 1.12 IU/mL — ABNORMAL HIGH (ref 0.30–0.70)
Heparin Unfractionated: 1.3 IU/mL — ABNORMAL HIGH (ref 0.30–0.70)

## 2019-11-18 LAB — CBC
HCT: 23.7 % — ABNORMAL LOW (ref 39.0–52.0)
Hemoglobin: 7.6 g/dL — ABNORMAL LOW (ref 13.0–17.0)
MCH: 31.1 pg (ref 26.0–34.0)
MCHC: 32.1 g/dL (ref 30.0–36.0)
MCV: 97.1 fL (ref 80.0–100.0)
Platelets: 202 10*3/uL (ref 150–400)
RBC: 2.44 MIL/uL — ABNORMAL LOW (ref 4.22–5.81)
RDW: 16.9 % — ABNORMAL HIGH (ref 11.5–15.5)
WBC: 14.9 10*3/uL — ABNORMAL HIGH (ref 4.0–10.5)
nRBC: 0.2 % (ref 0.0–0.2)

## 2019-11-18 LAB — BASIC METABOLIC PANEL
Anion gap: 12 (ref 5–15)
BUN: 144 mg/dL — ABNORMAL HIGH (ref 8–23)
CO2: 27 mmol/L (ref 22–32)
Calcium: 6.8 mg/dL — ABNORMAL LOW (ref 8.9–10.3)
Chloride: 107 mmol/L (ref 98–111)
Creatinine, Ser: 3.92 mg/dL — ABNORMAL HIGH (ref 0.61–1.24)
GFR, Estimated: 16 mL/min — ABNORMAL LOW (ref >60)
Glucose, Bld: 140 mg/dL — ABNORMAL HIGH (ref 70–99)
Potassium: 5.6 mmol/L — ABNORMAL HIGH (ref 3.5–5.1)
Sodium: 146 mmol/L — ABNORMAL HIGH (ref 135–145)

## 2019-11-18 MED ORDER — LACTULOSE 10 GM/15ML PO SOLN
20.0000 g | ORAL | Status: DC
  Administered 2019-11-18 – 2019-11-19 (×7): 20 g
  Filled 2019-11-18 (×7): qty 30

## 2019-11-18 MED ORDER — HEPARIN (PORCINE) 25000 UT/250ML-% IV SOLN
1700.0000 [IU]/h | INTRAVENOUS | Status: DC
  Administered 2019-11-18 (×2): 1700 [IU]/h via INTRAVENOUS
  Filled 2019-11-18: qty 250

## 2019-11-18 MED ORDER — LACTULOSE 10 GM/15ML PO SOLN
20.0000 g | ORAL | Status: DC
  Administered 2019-11-18: 20 g via ORAL
  Filled 2019-11-18: qty 30

## 2019-11-18 MED ORDER — "THROMBI-PAD 3""X3"" EX PADS"
1.0000 | MEDICATED_PAD | Freq: Once | CUTANEOUS | Status: DC
  Filled 2019-11-18: qty 1

## 2019-11-18 MED ORDER — HEPARIN (PORCINE) 25000 UT/250ML-% IV SOLN
1400.0000 [IU]/h | INTRAVENOUS | Status: DC
  Administered 2019-11-18: 1400 [IU]/h via INTRAVENOUS

## 2019-11-18 MED ORDER — QUETIAPINE FUMARATE 100 MG PO TABS
100.0000 mg | ORAL_TABLET | Freq: Two times a day (BID) | ORAL | Status: DC
  Administered 2019-11-18 – 2019-11-19 (×3): 100 mg
  Filled 2019-11-18 (×3): qty 1

## 2019-11-18 MED ORDER — INSULIN ASPART 100 UNIT/ML ~~LOC~~ SOLN
0.0000 [IU] | SUBCUTANEOUS | Status: DC
  Administered 2019-11-19 (×2): 1 [IU] via SUBCUTANEOUS

## 2019-11-18 MED ORDER — METOPROLOL TARTRATE 25 MG PO TABS
25.0000 mg | ORAL_TABLET | Freq: Two times a day (BID) | ORAL | Status: DC
  Administered 2019-11-18 (×2): 25 mg
  Filled 2019-11-18 (×2): qty 1

## 2019-11-18 NOTE — Progress Notes (Signed)
ANTICOAGULATION CONSULT NOTE   Pharmacy Consult for heparin Indication: BLE DVT  Assessment: 64yo male had been admitted 10/15 for Covid PNA, decompensated overnight and was tx'd to the ICU and intubated, ECG reveals STEMI, to start heparin while awaiting transfer to the cath lab. Patient went to cath lab, pharmacy was reconsulted to start heparin 8 hours after sheath pulled.  [place] 10/17 revealed BLE DVTs. Heparin was held for~10 hours for lumbar puncture 10/20. Was restarted at previous rate of 2000 units/hr.  Hg 7.6, Hct 23.7 (trending down, will monitor) Scr 3.92, trending up No s/sx bleeding noted.  Heparin level supratherapeutic (1.14). Was redrawn to confirm- second level 1.12. Likely high due to worsening renal function.  Goal of Therapy:  Heparin level 0.3-0.7 units/ml Monitor platelets by anticoagulation protocol: Yes   Plan:  Hold heparin for 1 hour Restart heparin at 1700 units/hr Recheck HL 8 hours after restart Daily HL and CBC Monitor for s/sx of bleeding  Thanks for allowing pharmacy to be a part of this patient's care.  Arletha Pili, PharmD Candidate 11/18/2019 6:39 AM

## 2019-11-18 NOTE — Progress Notes (Signed)
eLink Physician-Brief Progress Note Patient Name: Charles Dawson DOB: 11/08/1955 MRN: [id number]   Date of Service  11/18/2019  HPI/Events of Note  Patient is oozing at arterial line site.  eICU Interventions  Thrombi pad ordered for local hemostasis.        [name] [name] 11/18/2019, 10:50 PM

## 2019-11-18 NOTE — Progress Notes (Signed)
eLink Physician-Brief Progress Note Patient Name: Charles Dawson DOB: Jun 01, 1955 MRN: [id number]   Date of Service  11/18/2019  HPI/Events of Note  Hyperglycemia.  eICU Interventions  Q 4 hourly CBG with SSI coverage ordered utilizing the sensitive scale due to patient's estimated GFR of 16.        [name] Estie Sproule 11/18/2019, 11:39 PM

## 2019-11-18 NOTE — Progress Notes (Signed)
ANTICOAGULATION CONSULT NOTE   Pharmacy Consult for heparin Indication: BLE DVT  Assessment: 64yo male had been admitted 10/15 for Covid PNA, decompensated overnight and was tx'd to the ICU and intubated, ECG reveals STEMI, to start heparin while awaiting transfer to the cath lab. Patient went to cath lab, pharmacy was reconsulted to start heparin 8 hours after sheath pulled.  [place] 10/17 revealed BLE DVTs. Heparin was held for~10 hours for lumbar puncture 10/20. Was restarted at previous rate of 2000 units/hr.  Hg 7.6, Hct 23.7 (trending down, will monitor) Scr 3.92, trending up No s/sx bleeding noted.  Heparin level supratherapeutic (1.3). Likely high due to worsening renal function.  Goal of Therapy:  Heparin level 0.3-0.7 units/ml Monitor platelets by anticoagulation protocol: Yes   Plan:  Hold heparin for 1 hour Restart heparin at 1400 units/hr Recheck HL 8 hours after restart Daily HL and CBC Monitor for s/sx of bleeding  Thanks for allowing pharmacy to be a part of this patient's care.  Jeanella Cara, PharmD, [facility] Clinical Pharmacist Please see AMION for all Pharmacists' Contact Phone Numbers 11/18/2019, 8:22 PM

## 2019-11-18 NOTE — Progress Notes (Signed)
NAME:  Charles Dawson, MRN:  [id number], DOB:  [birth year], LOS: 7 ADMISSION DATE:  11/06/2019, CONSULTATION DATE: 11/17/2019 REFERRING MD: Internal medicine residency service, CHIEF COMPLAINT: Patient with COVID-19  Brief History   Patient is a 64 year old admitted with acute hypoxic respiratory failure due to COVID-19 pneumonia and acute NSTEMI, underwent cardiac cath not amenable to angioplasty    Past Medical History   Past Medical History:  Diagnosis Date  . Anxiety   . 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  He was placed on pressure support trial, has been tolerating well, will try to decrease sedation as tolerated  Objective   Blood pressure 110/72, pulse 92, temperature (!) 97.3 F (36.3 C), temperature source Axillary, resp. rate (!) 32, height 5\' 8"  (1.727 m), weight 87.5 kg, SpO2 94 %. CVP:  [6 mmHg-11 mmHg] 11 mmHg  Vent Mode: PRVC FiO2 (%):  [60 %] 60 % Set Rate:  [32 bmp] 32 bmp Vt Set:  [550 mL] 550 mL PEEP:  [8 cmH20] 8 cmH20 Plateau Pressure:  [21 cmH20-25 cmH20] 21 cmH20   Intake/Output Summary (Last 24 hours) at 11/18/2019 1009 Last data filed at 11/18/2019 0932 Gross per 24 hour  Intake 3470.25 ml  Output 4835 ml  Net -1364.75 ml   Filed Weights   11/16/19 0500 11/17/19 0500 11/18/19 0400  Weight: 85.7 kg 89.7 kg 87.5 kg   CVP:  [6 mmHg-11 mmHg] 11 mmHg  Examination: General: Elderly Caucasian male, orally intubated, lying on the bed HENT: Atraumatic, normocephalic ET tube and OG tube in place. Lungs: Clear anteriorly.  Occasional dyssynchrony. Cardiovascular: Regular rate and rhythm, no murmur Abdomen: Benign bowel sounds positive abdomen is soft, tolerating tube  feeds Extremities: No edema Neuro: Sedated no response to painful stimuli. GU: Catheter in place  Resolved Hospital Problem list   NA  Assessment & Plan:  Acute hypoxic/hypercapnic respiratory failure due to ARDS from COVID-19 pneumonia  Continue mechanical ventilation per ARDS protocol Target TVol 6 cc/kgIBW Patient has been tolerating pressure support trial, continue exercising his lungs Decrease sedation with RASS goal of -1 to -2 Titrate FiO2 with goal O2 sat 88-92% Continue with dexamethasone to complete 10 days therapy   ST elevation MI-not amenable to percutaneous revascularization. Patient will need a single-vessel bypass surgery once he gets better but to defer this decision to cardiology at the moment Continue Aspirin, metoprolol and atorvastatin Echo revealed EF of 35 to 40% Currently start him on ACE inhibitor due to AKI, will reevaluate every day   Multifactorial shock due to pneumococcal pneumonia, COVID-19, and possible cardiogenic shock requiring titration of norepinephrine and epinephrine, improved Patient is off vaso-pressors   Pneumococcal bacteremia with severe sepsis Acute meningitis was ruled out with CSF not consistent with meningitis. Continue IV ceftriaxone  Possible alcohol withdrawal alcohol withdrawal, however may have been symptoms of myocardial infarction. Continue enteral benzodiazepines, Seroquel, and oxycodone. Start weaning fentanyl first and then Versed infusion  Acute deep vein thrombosis involving the left femoral vein, and left gastrocnemius veins.  -Continue IV heparin.  Acute kidney injury - slow worsening but no indications for dialysis at this time Maintain euvolemia and normotension Patient received Lasix 100 mg yesterday, he remained in positive fluid balance and worsening AKI We will hold off on Lasix  today Follow kidney function  Anemia of critical illness Patient's hemoglobin is stable 7-8  Best practice:  Diet:  Tolerating tube feeds Pain/Anxiety/Delirium protocol (if indicated): midazolam and fentanyl. Target RASS -1 to -2  VAP protocol (if indicated): Yes DVT prophylaxis: IV heparin for active DVT GI prophylaxis: Pepcid Glucose control: Stress hyperglycemia well controlled on sliding scale insulin alone. Mobility: Bedrest Code Status: Full code Family Communication: We will update family today Disposition: ICU  Labs   CBC: Recent Labs  Lab 11/12/19 0134 11/12/19 0134 2019-11-18 0500 Nov 18, 2019 0516 11/14/19 0500 11/14/19 1204 11/15/19 0433 11/16/19 0015 11/16/19 0417 11/16/19 0506 11/17/19 0434 11/17/19 0445 11/17/19 1200 11/18/19 0354 11/18/19 0436  WBC 7.2   < > 19.2*   < > 20.4*  --  12.7*  --  12.8*  --   --  10.3  --   --  14.9*  NEUTROABS 5.5  --  16.4*  --  18.0*  --  10.8*  --  11.9*  --   --   --   --   --   --   HGB 11.9*   < > 15.2   < > 11.2*   < > 10.4*   < > 9.4*   < > 6.8* 7.6* 7.8* 7.1* 7.6*  HCT 34.6*   < > 45.5   < > 35.0*   < > 32.9*   < > 29.9*   < > 20.0* 23.7* 24.0* 21.0* 23.7*  MCV 93.5   < > 94.2   < > 99.4  --  99.7  --  101.7*  --   --  97.9  --   --  97.1  PLT 227   < > 204   < > 163  --  222  --  262  --   --  199  --   --  202   < > = values in this interval not displayed.    Basic Metabolic Panel: Recent Labs  Lab 11-18-2019 0500 2019-11-18 0516 11/14/19 0500 11/14/19 1204 11/15/19 0433 11/16/19 0015 11/16/19 0417 11/16/19 0506 11/16/19 0906 11/16/19 0906 11/16/19 1730 11/17/19 0434 11/17/19 0546 11/18/19 0354 11/18/19 0436  NA 137   < > 141   < > 143   < > 145   < > 143   < > 144 145 145 147* 146*  K 3.5   < > 4.8   < > 5.1   < > 5.1   < > 5.1   < > 4.9 4.9 4.9 5.4* 5.6*  CL 101   < > 107  --  110  --  112*  --  110  --  110  --  108  --  107  CO2 26   < > 22  --  25  --  21*  --  22  --  24  --  23  --  27  GLUCOSE 132*   < > 170*  --  120*  --  106*  --  161*  --  142*  --  114*  --  140*  BUN 20   < > 40*  --  53*  --  79*  --  83*  --   93*  --  108*  --  144*  CREATININE 0.66   < > 1.50*  --  1.87*  --  2.87*  --  2.88*  --  3.09*  --  3.30*  --  3.92*  CALCIUM  7.9*   < > 6.9*  --  7.3*  --  6.7*  --  6.9*  --  6.7*  --  6.7*  --  6.8*  MG 2.1  --  2.4  --  2.6*  --  2.7*  --   --   --   --   --  2.8*  --   --   PHOS 3.9  --  5.4*  --  4.4  --  5.9*  --   --   --   --   --  5.9*  --   --    < > = values in this interval not displayed.   GFR: Estimated Creatinine Clearance: 19.9 mL/min (A) (by C-G formula based on SCr of 3.92 mg/dL (H)). Recent Labs  Lab 11/06/2019 1034 11/22/2019 1655 11/12/19 0134 11/12/19 0455 11/02/2019 0500 11/17/2019 0513 11/18/2019 1029 11/14/19 0500 11/15/19 0433 11/16/19 0417 11/17/19 0445 11/18/19 0436  PROCALCITON 0.56  --   --   --   --   --   --   --   --   --   --   --   WBC 12.6*   < > 7.2  --    < >  --   --    < > 12.7* 12.8* 10.3 14.9*  LATICACIDVEN 2.4*   < > 2.4* 2.2*  --  3.2* 3.0*  --   --   --   --   --    < > = values in this interval not displayed.    Liver Function Tests: Recent Labs  Lab 11/12/19 0134 11/06/2019 0500 11/14/19 0500 11/15/19 0433 11/16/19 0417  AST 61* 77* 75* 51* 47*  ALT 27 34 32 29 23  ALKPHOS 57 107 75 69 79  BILITOT 0.6 0.9 0.5 0.6 1.0  PROT 5.2* 5.6* 5.3* 5.1* 5.1*  ALBUMIN 2.0* 2.2* 1.7* 1.7* 1.4*   No results for input(s): LIPASE, AMYLASE in the last 168 hours. No results for input(s): AMMONIA in the last 168 hours.  ABG    Component Value Date/Time   PHART 7.314 (L) 11/18/2019 0354   PCO2ART 57.5 (H) 11/18/2019 0354   PO2ART 69 (L) 11/18/2019 0354   HCO3 29.1 (H) 11/18/2019 0354   TCO2 31 11/18/2019 0354   ACIDBASEDEF 2.0 11/16/2019 0506   O2SAT 91.0 11/18/2019 0354     Coagulation Profile: Recent Labs  Lab 11/12/2019 0500  INR 1.5*   HbA1C: No results found for: HGBA1C  CBG: Recent Labs  Lab 11/17/19 1547 11/17/19 1941 11/17/19 2326 11/18/19 0338 11/18/19 0726  GLUCAP 72 81 99 112* 139*   Cardiac Panel (last 3  results) Recent Labs    11/16/19 1230  CKTOTAL 137   Total critical care time: 43 minutes  Performed by: Cheri Fowler   Critical care time was exclusive of separately billable procedures and treating other patients.   Critical care was necessary to treat or prevent imminent or life-threatening deterioration.   Critical care was time spent personally by me on the following activities: development of treatment plan with patient and/or surrogate as well as nursing, discussions with consultants, evaluation of patient's response to treatment, examination of patient, obtaining history from patient or surrogate, ordering and performing treatments and interventions, ordering and review of laboratory studies, ordering and review of radiographic studies, pulse oximetry and re-evaluation of patient's condition.   Cheri Fowler MD Critical care physician [facility] [place] [facility]  Pager: [phone] Mobile: [phone]

## 2019-11-18 NOTE — Progress Notes (Signed)
[facility] will sign off.   Medication Recommendations:  No recommendations Other recommendations (labs, testing, etc):  None Follow up:  Notify us when appropriate concerning revascularization. He will need to fully recover before consideration of CABG.

## 2019-11-19 ENCOUNTER — Inpatient Hospital Stay (HOSPITAL_COMMUNITY): Payer: Medicare Other

## 2019-11-19 LAB — NA AND K (SODIUM & POTASSIUM), RAND UR
Potassium Urine: 19 mmol/L
Sodium, Ur: 66 mmol/L

## 2019-11-19 LAB — POCT I-STAT 7, (LYTES, BLD GAS, ICA,H+H)
Acid-Base Excess: 3 mmol/L — ABNORMAL HIGH (ref 0.0–2.0)
Bicarbonate: 30.1 mmol/L — ABNORMAL HIGH (ref 20.0–28.0)
Calcium, Ion: 0.97 mmol/L — ABNORMAL LOW (ref 1.15–1.40)
HCT: 25 % — ABNORMAL LOW (ref 39.0–52.0)
Hemoglobin: 8.5 g/dL — ABNORMAL LOW (ref 13.0–17.0)
O2 Saturation: 86 %
Patient temperature: 98.2
Potassium: 6.4 mmol/L (ref 3.5–5.1)
Sodium: 150 mmol/L — ABNORMAL HIGH (ref 135–145)
TCO2: 32 mmol/L (ref 22–32)
pCO2 arterial: 61.1 mmHg — ABNORMAL HIGH (ref 32.0–48.0)
pH, Arterial: 7.299 — ABNORMAL LOW (ref 7.350–7.450)
pO2, Arterial: 58 mmHg — ABNORMAL LOW (ref 83.0–108.0)

## 2019-11-19 LAB — CULTURE, BLOOD (ROUTINE X 2): Special Requests: ADEQUATE

## 2019-11-19 LAB — BASIC METABOLIC PANEL
Anion gap: 11 (ref 5–15)
Anion gap: 12 (ref 5–15)
Anion gap: 13 (ref 5–15)
Anion gap: 12 (ref 5–15)
BUN: 158 mg/dL — ABNORMAL HIGH (ref 8–23)
BUN: 155 mg/dL — ABNORMAL HIGH (ref 8–23)
BUN: 158 mg/dL — ABNORMAL HIGH (ref 8–23)
BUN: 158 mg/dL — ABNORMAL HIGH (ref 8–23)
CO2: 27 mmol/L (ref 22–32)
CO2: 28 mmol/L (ref 22–32)
CO2: 27 mmol/L (ref 22–32)
CO2: 27 mmol/L (ref 22–32)
Calcium: 7.2 mg/dL — ABNORMAL LOW (ref 8.9–10.3)
Calcium: 7.4 mg/dL — ABNORMAL LOW (ref 8.9–10.3)
Calcium: 7.4 mg/dL — ABNORMAL LOW (ref 8.9–10.3)
Calcium: 7.6 mg/dL — ABNORMAL LOW (ref 8.9–10.3)
Chloride: 110 mmol/L (ref 98–111)
Chloride: 110 mmol/L (ref 98–111)
Chloride: 108 mmol/L (ref 98–111)
Chloride: 111 mmol/L (ref 98–111)
Creatinine, Ser: 4.01 mg/dL — ABNORMAL HIGH (ref 0.61–1.24)
Creatinine, Ser: 3.96 mg/dL — ABNORMAL HIGH (ref 0.61–1.24)
Creatinine, Ser: 3.88 mg/dL — ABNORMAL HIGH (ref 0.61–1.24)
Creatinine, Ser: 3.93 mg/dL — ABNORMAL HIGH (ref 0.61–1.24)
GFR, Estimated: 16 mL/min — ABNORMAL LOW (ref >60)
GFR, Estimated: 16 mL/min — ABNORMAL LOW (ref >60)
GFR, Estimated: 16 mL/min — ABNORMAL LOW (ref >60)
GFR, Estimated: 16 mL/min — ABNORMAL LOW (ref >60)
Glucose, Bld: 179 mg/dL — ABNORMAL HIGH (ref 70–99)
Glucose, Bld: 139 mg/dL — ABNORMAL HIGH (ref 70–99)
Glucose, Bld: 137 mg/dL — ABNORMAL HIGH (ref 70–99)
Glucose, Bld: 147 mg/dL — ABNORMAL HIGH (ref 70–99)
Potassium: 6.2 mmol/L — ABNORMAL HIGH (ref 3.5–5.1)
Potassium: 6.4 mmol/L (ref 3.5–5.1)
Potassium: 6.5 mmol/L (ref 3.5–5.1)
Potassium: 6.7 mmol/L (ref 3.5–5.1)
Sodium: 148 mmol/L — ABNORMAL HIGH (ref 135–145)
Sodium: 150 mmol/L — ABNORMAL HIGH (ref 135–145)
Sodium: 148 mmol/L — ABNORMAL HIGH (ref 135–145)
Sodium: 150 mmol/L — ABNORMAL HIGH (ref 135–145)

## 2019-11-19 LAB — HEPARIN LEVEL (UNFRACTIONATED)
Heparin Unfractionated: 1.1 IU/mL — ABNORMAL HIGH (ref 0.30–0.70)
Heparin Unfractionated: 0.73 IU/mL — ABNORMAL HIGH (ref 0.30–0.70)

## 2019-11-19 LAB — GLUCOSE, CAPILLARY
Glucose-Capillary: 181 mg/dL — ABNORMAL HIGH (ref 70–99)
Glucose-Capillary: 115 mg/dL — ABNORMAL HIGH (ref 70–99)
Glucose-Capillary: 192 mg/dL — ABNORMAL HIGH (ref 70–99)
Glucose-Capillary: 135 mg/dL — ABNORMAL HIGH (ref 70–99)
Glucose-Capillary: 129 mg/dL — ABNORMAL HIGH (ref 70–99)

## 2019-11-19 LAB — POTASSIUM: Potassium: 6.7 mmol/L (ref 3.5–5.1)

## 2019-11-19 MED ORDER — HEPARIN (PORCINE) 25000 UT/250ML-% IV SOLN
1200.0000 [IU]/h | INTRAVENOUS | Status: DC
  Administered 2019-11-19: 1200 [IU]/h via INTRAVENOUS
  Filled 2019-11-19: qty 250

## 2019-11-19 MED ORDER — SODIUM BICARBONATE 8.4 % IV SOLN
100.0000 meq | Freq: Once | INTRAVENOUS | Status: AC
  Administered 2019-11-19: 100 meq via INTRAVENOUS
  Filled 2019-11-19: qty 100

## 2019-11-19 MED ORDER — DEXTROSE 50 % IV SOLN
1.0000 | Freq: Once | INTRAVENOUS | Status: AC
  Administered 2019-11-19: 50 mL via INTRAVENOUS
  Filled 2019-11-19: qty 50

## 2019-11-19 MED ORDER — BISACODYL 10 MG RE SUPP
10.0000 mg | Freq: Once | RECTAL | Status: AC
  Administered 2019-11-19: 10 mg via RECTAL
  Filled 2019-11-19: qty 1

## 2019-11-19 MED ORDER — INSULIN ASPART 100 UNIT/ML IV SOLN
5.0000 [IU] | Freq: Once | INTRAVENOUS | Status: AC
  Administered 2019-11-19: 5 [IU] via INTRAVENOUS

## 2019-11-19 MED ORDER — FREE WATER
200.0000 mL | Status: DC
  Administered 2019-11-19 (×3): 200 mL

## 2019-11-19 MED ORDER — MORPHINE SULFATE (PF) 4 MG/ML IV SOLN
4.0000 mg | INTRAVENOUS | Status: DC | PRN
  Administered 2019-11-19 (×2): 4 mg via INTRAVENOUS
  Filled 2019-11-19 (×2): qty 1

## 2019-11-19 MED ORDER — CALCIUM GLUCONATE-NACL 1-0.675 GM/50ML-% IV SOLN
1.0000 g | Freq: Once | INTRAVENOUS | Status: AC
  Administered 2019-11-19: 1000 mg via INTRAVENOUS
  Filled 2019-11-19: qty 50

## 2019-11-19 MED ORDER — SODIUM ZIRCONIUM CYCLOSILICATE 10 G PO PACK
10.0000 g | PACK | Freq: Once | ORAL | Status: AC
  Administered 2019-11-19: 10 g via ORAL
  Filled 2019-11-19: qty 1

## 2019-11-19 MED ORDER — MORPHINE SULFATE (PF) 2 MG/ML IV SOLN
2.0000 mg | INTRAVENOUS | Status: DC | PRN
  Administered 2019-11-19 (×3): 2 mg via INTRAVENOUS
  Filled 2019-11-19 (×2): qty 1

## 2019-11-19 MED ORDER — MORPHINE SULFATE (PF) 2 MG/ML IV SOLN
2.0000 mg | INTRAVENOUS | Status: DC | PRN
  Administered 2019-11-19: 2 mg via INTRAVENOUS
  Filled 2019-11-19 (×2): qty 1

## 2019-11-19 MED ORDER — MIDAZOLAM BOLUS VIA INFUSION
1.0000 mg | INTRAVENOUS | Status: DC | PRN
  Administered 2019-11-19 (×6): 2 mg via INTRAVENOUS
  Filled 2019-11-19: qty 2

## 2019-11-19 MED ORDER — METOPROLOL TARTRATE 50 MG PO TABS
50.0000 mg | ORAL_TABLET | Freq: Two times a day (BID) | ORAL | Status: DC
  Administered 2019-11-19: 50 mg
  Filled 2019-11-19: qty 1

## 2019-11-19 MED ORDER — SODIUM ZIRCONIUM CYCLOSILICATE 10 G PO PACK
10.0000 g | PACK | Freq: Once | ORAL | Status: AC
  Administered 2019-11-19: 10 g
  Filled 2019-11-19: qty 1

## 2019-11-20 LAB — CSF CULTURE W GRAM STAIN: Culture: NO GROWTH

## 2019-11-20 LAB — CULTURE, BLOOD (ROUTINE X 2): Special Requests: ADEQUATE

## 2019-11-21 LAB — HEMOGLOBIN A1C
Hgb A1c MFr Bld: 5.9 % — ABNORMAL HIGH (ref 4.8–5.6)
Mean Plasma Glucose: 123 mg/dL

## 2019-11-23 LAB — CULTURE, BLOOD (ROUTINE X 2)
Culture: NO GROWTH
Culture: NO GROWTH
Special Requests: ADEQUATE
Special Requests: ADEQUATE

## 2019-11-24 LAB — POCT I-STAT 7, (LYTES, BLD GAS, ICA,H+H)
Acid-base deficit: 2 mmol/L (ref 0.0–2.0)
Bicarbonate: 24.7 mmol/L (ref 20.0–28.0)
Calcium, Ion: 0.97 mmol/L — ABNORMAL LOW (ref 1.15–1.40)
HCT: 26 % — ABNORMAL LOW (ref 39.0–52.0)
Hemoglobin: 8.8 g/dL — ABNORMAL LOW (ref 13.0–17.0)
O2 Saturation: 92 %
Patient temperature: 100
Potassium: 7 mmol/L (ref 3.5–5.1)
Sodium: 143 mmol/L (ref 135–145)
TCO2: 26 mmol/L (ref 22–32)
pCO2 arterial: 55.2 mmHg — ABNORMAL HIGH (ref 32.0–48.0)
pH, Arterial: 7.262 — ABNORMAL LOW (ref 7.350–7.450)
pO2, Arterial: 79 mmHg — ABNORMAL LOW (ref 83.0–108.0)

## 2019-11-28 NOTE — Progress Notes (Signed)
eLink Physician-Brief Progress Note Patient Name: Charles Dawson DOB: [birth year] MRN: [id number]   Date of Service  11/07/2019  HPI/Events of Note  Portable CXR reviewed by me.  eICU Interventions  No obvious new pneumothorax seen, extensive bilateral parenchymal lung disease consistent with Covid pneumonia and lung injury.        [name] [name] 11/23/2019, 6:32 AM

## 2019-11-28 NOTE — Death Summary Note (Signed)
DEATH SUMMARY   Patient Details  Name: Charles Dawson MRN: [id number] DOB: 10-30-1955  Admission/Discharge Information   Admit Date:  11-16-2019  Date of Death: Date of Death: November 24, 2019  Time of Death: Time of Death: 06-09-2031  Length of Stay: 8  Referring Physician: Default, Provider, MD   Reason(s) for Hospitalization  Acute hypoxic/hypercapnic respiratory failure due to ARDS from COVID-19 pneumonia Pneumococcal/MSSA pneumonia Acute ST elevation MI Pneumococcal bacteremia with severe sepsis Acute left lower extremity DVT Acute kidney injury Refractory hyperkalemia Hypernatremia Anemia of critical illness Severe malnutrition Diagnoses  Preliminary cause of death:  Secondary Diagnoses (including complications and co-morbidities):  Principal Problem:   Pneumonia due to COVID-19 virus Active Problems:   Acute ST elevation myocardial infarction (STEMI) of anterior wall (HCC)   Endotracheal tube present   Coronary aneurysm   Protein-calorie malnutrition, severe   Brief Hospital Course (including significant findings, care, treatment, and services provided and events leading to death)  Charles Dawson is a 64 y.o. year old male who was presented with complaints of shortness of breath hypoxemia found to be Covid positive with COVID pneumonia by chest x-ray and clinical exam.  Patient was started on remdesivir and Decadron. I was asked to see the patient urgently due to desaturation.  The patient had earlier in the evening become combative.  He has a questionable history of heavy alcohol abuse.  He drinks about 4 cases of beer a week.  The general feeling at that time was that he was in alcohol withdrawal with worsening hypoxemia due to his Covid pneumonia.  On my evaluation the patient was extremely agitated pulling his oxygen off oxygen saturations in the mid 70s.  Is urgently transferred to the ICU and intubated.  During intubation his oxygen saturations remained in the 60s.  At the start  of intubation his oxygen saturation was 70%.  After the ET tube was placed there was significant frothy red secretions.  Follow-up chest x-ray is still pending. Intubation the patient was placed on PEEP of 14, 100% oxygen saturation came back up into the 90s.  Patient was noted to have what acute ST elevation MI, he underwent cardiac cath, noted to have mid LAD lesion which was not amenable to angioplasty or stenting, cardiology recommend medical management for now and single-vessel CABG once patient gets better.  He was started on aspirin, Plavix, statins and IV heparin for 48 hours. Patient's continued to remain hypoxemic and hypercapnic on the ventilator, he grew pneumococcal bacteremia and his respiratory culture grew pneumococcal and MSSA pneumonia, patient initially received vancomycin, ceftriaxone IV acyclovir, LP was done which ruled out meningitis, then antibiotics were switched to IV ceftriaxone.  Patient developed acute kidney injury with resistant hyperkalemia, renal was consulted they recommend hemodialysis, after discussion with family that patient will require hemodialysis considering he has acute renal failure with resistant hyperkalemia, patient's family suggested continue with medical management do not restart hemodialysis Patient had a history of alcohol abuse, he was continued on alcohol withdrawal protocol including benzodiazepine, thiamine and multivitamin, he was diagnosed with acute DVT of left lower leg for which he was continued on IV heparin.  Patient did have hypernatremia and hyperkalemia for which he received multiple doses of calcium gluconate, sodium bicarbonate, D5 50 and insulin along with Lokelma.  His hemoglobin remained around 7-8 also he was noted to have severe malnutrition. As patient was going into multiorgan failure including respiratory failure, acute kidney failure and with acute ST elevation MI, after discussion with  family as did refused to start dialysis, they  opted to proceed with DNR/DNI and comfort care with palliative extubation. Patient was declared dead on 2019/11/26 at 8:33 PM  Pertinent Labs and Studies  Significant Diagnostic Studies DG Chest 1 View  Result Date: November 20, 2019 CLINICAL DATA:  Dyspnea EXAM: CHEST  1 VIEW COMPARISON:  11/22/2019 FINDINGS: Unchanged appearance of the lungs with suspected pulmonary edema. No pleural effusion or pneumothorax. Cardiomediastinal contours are normal. IMPRESSION: Unchanged appearance of the lungs with suspected pulmonary edema. Electronically Signed   By: Deatra Robinson M.D.   On: 20-Nov-2019 04:14   CT ANGIO CHEST PE W OR WO CONTRAST  Result Date: 11/12/2019 CLINICAL DATA:  Shortness of breath, positive D-dimer EXAM: CT ANGIOGRAPHY CHEST WITH CONTRAST TECHNIQUE: Multidetector CT imaging of the chest was performed using the standard protocol during bolus administration of intravenous contrast. Multiplanar CT image reconstructions and MIPs were obtained to evaluate the vascular anatomy. CONTRAST:  OMNIPAQUE IOHEXOL 350 MG/ML SOLN COMPARISON:  Chest radiograph dated 10/28/2019 FINDINGS: Cardiovascular: Satisfactory opacification of the bilateral pulmonary arteries to the lobar level. Evaluation is constrained by respiratory motion. No evidence of pulmonary embolism. Although not tailored for evaluation of the thoracic aorta, there is no evidence of thoracic aortic aneurysm or dissection. Mild atherosclerotic calcifications aortic root. The heart is normal in size.  No pericardial effusion. Mild coronary atherosclerosis of the LAD and right coronary artery. Mediastinum/Nodes: Small mediastinal lymph nodes which do not meet pathologic CT size criteria. Visualized thyroid is unremarkable. Lungs/Pleura: Multifocal/diffuse ground-glass opacities in the lungs bilaterally, with relative sparing of the lung apices (right greater than left), and a slight subpleural/peripheral predominance in the upper lobes. No  associated interlobular septal thickening. No associated pleural effusions. This appearance favors multifocal pneumonia on the basis of atypical/viral infection such as COVID rather than interstitial edema. Although evaluation is constrained by motion degradation, there are no suspicious pulmonary nodules. Mild centrilobular and paraseptal emphysematous changes at the lung apices. No pneumothorax. Upper Abdomen: Visualized upper abdomen is grossly unremarkable. Musculoskeletal: Degenerative changes of the visualized thoracolumbar spine. Review of the MIP images confirms the above findings. IMPRESSION: No evidence of pulmonary embolism. Multifocal pneumonia, suspicious for COVID. Aortic Atherosclerosis (ICD10-I70.0) and Emphysema (ICD10-J43.9). Electronically Signed   By: Charline Bills M.D.   On: 11/12/2019 04:54   CARDIAC CATHETERIZATION  Result Date: November 20, 2019  Prox RCA lesion is 20% stenosed.  Dist RCA lesion is 50% stenosed.  RPDA lesion is 60% stenosed.  Ost LAD to Prox LAD lesion is 40% stenosed.  Mid LAD lesion is 90% stenosed.  Large aneurysm arising from the mid LAD/Diagonal bifurcation.  1. The LAD is a large caliber vessel that courses to the apex. The proximal LAD has diffuse calcified disease but no focal stenosis. The mid LAD has a severe stenosis involving a moderate caliber diagonal branch. There is a large aneurysm that appears to arise from the LAD but it may arise from the proximal Diagonal. There is flow down into the distal LAD 2. No obstructive disease in the Circumflex 3. The RCA is a large dominant vessel with mild proximal stenosis and moderate distal stenosis. The PDA has a moderate ostial stenosis. 4. LVEDP 11 mmHg Recommendations: He has severe disease in the mid LAD at the bifurcation of a diagonal branch. There is a large aneurysm involving this bifurcation. There is flow down the LAD to the apex. I suspect that the myocardial ischemia leading to the EKG changes and  elevated troponin is  due to this lesion in the LAD with increased demand due to sepsis and hypoxia. This is not a favorable lesion for PCI due to the aneurysm. A covered stent cannot be used as this would occlude the moderate caliber diagonal branch. He will need consideration for bypass of the LAD. I will ask CT surgery to see him today but surgery will most likely be delayed for now due to his current sepsis and pneumonia. Continue Levophed for support. Echo later today. Resume heparin 8 hours post sheath pull. Will continue ASA.  Start a statin  DG Chest Port 1 View  Result Date: 11/26/2019 CLINICAL DATA:  Acute respiratory failure. EXAM: PORTABLE CHEST 1 VIEW COMPARISON:  Six days ago FINDINGS: Extensive interstitial and airspace opacity. Increased hazy density at the bases which is incompletely covered. Endotracheal tube tip is halfway between the clavicular heads and carina. Right subclavian line with tip at the SVC. Enteric tube which reaches the stomach. Stable heart size. No visible pneumothorax. IMPRESSION: 1. Continued confluent bilateral pneumonia. 2. Increased hazy density at the bases which may be pleural fluid. 3. Unremarkable hardware positioning. Electronically Signed   By: Marnee Spring M.D.   On: 10/30/2019 08:21   DG CHEST PORT 1 VIEW  Result Date: 11/25/2019 CLINICAL DATA:  Status post central line placement EXAM: PORTABLE CHEST 1 VIEW COMPARISON:  11/07/2019 FINDINGS: Endotracheal tube and gastric catheter are again seen and stable. Right subclavian central line is noted in the proximal superior vena cava. No pneumothorax is noted. Patchy airspace opacities are again identified and stable. Cardiac shadow is stable. IMPRESSION: No pneumothorax following central line placement. The remainder of the exam is stable from the previous study Electronically Signed   By: Alcide Clever M.D.   On: 11/04/2019 15:15   Portable Chest x-ray  Result Date: 11/06/2019 CLINICAL DATA:  Endotracheal  tube present EXAM: PORTABLE CHEST 1 VIEW COMPARISON:  11/10/2019 FINDINGS: Endotracheal tube tip is just below the level of the clavicular heads. Enteric tube side port is just past the gastroesophageal junction. Moderate interstitial pulmonary edema is unchanged. IMPRESSION: 1. Endotracheal tube tip just below the level of the clavicular heads. 2. Enteric tube side port just past the gastroesophageal junction. Recommend advancing by 5 cm. Electronically Signed   By: Deatra Robinson M.D.   On: 11/21/2019 05:15   DG Chest Port 1 View  Result Date: Nov 18, 2019 CLINICAL DATA:  Shortness of breath. EXAM: PORTABLE CHEST 1 VIEW COMPARISON:  01/26/2012 FINDINGS: Heart size remains within normal limits. Pleural-parenchymal scarring again seen in the left lung base. Diffuse bilateral pulmonary airspace disease is new since previous study. No definite evidence of pleural effusion. IMPRESSION: New diffuse bilateral pulmonary airspace disease suspicious for diffuse pulmonary edema, with infection considered less likely. Electronically Signed   By: Danae Orleans M.D.   On: 11-18-2019 11:03   DG Abd Portable 1V  Result Date: 11/06/2019 CLINICAL DATA:  NG tube placement EXAM: PORTABLE ABDOMEN - 1 VIEW COMPARISON:  None. FINDINGS: Enteric tube terminates in the proximal gastric body. Defibrillator pads overlying the left hemithorax. Nonobstructive bowel gas pattern. Mild degenerative changes of the thoracolumbar spine. IMPRESSION: Enteric tube terminates in the proximal gastric body. Electronically Signed   By: Charline Bills M.D.   On: 11/08/2019 09:07   VAS [place] LOWER EXTREMITY VENOUS (DVT)  Result Date: 11/22/2019  Lower Venous DVT Study Indications: Covid-19, elevated D-Dimer.  Limitations: Line. Comparison Study: No prior study Performing Technologist: Sherren Kerns RVS  Examination Guidelines: A complete  evaluation includes B-mode imaging, spectral Doppler, color Doppler, and power Doppler as needed of all  accessible portions of each vessel. Bilateral testing is considered an integral part of a complete examination. Limited examinations for reoccurring indications may be performed as noted. The reflux portion of the exam is performed with the patient in reverse Trendelenburg.  +---------+---------------+---------+-----------+----------+-------------------+ RIGHT    CompressibilityPhasicitySpontaneityPropertiesThrombus Aging      +---------+---------------+---------+-----------+----------+-------------------+ CFV      Full           Yes      Yes                                      +---------+---------------+---------+-----------+----------+-------------------+ SFJ      Full                                                             +---------+---------------+---------+-----------+----------+-------------------+ FV Prox  Full                                                             +---------+---------------+---------+-----------+----------+-------------------+ FV Mid   Full                                                             +---------+---------------+---------+-----------+----------+-------------------+ FV DistalFull                                                             +---------+---------------+---------+-----------+----------+-------------------+ PFV      Full                                                             +---------+---------------+---------+-----------+----------+-------------------+ POP                     Yes      Yes                  patent by color and                                                       Doppler             +---------+---------------+---------+-----------+----------+-------------------+ PTV      None  Acute               +---------+---------------+---------+-----------+----------+-------------------+ PERO     None                                          Acute               +---------+---------------+---------+-----------+----------+-------------------+ Gastroc  None                                         Acute               +---------+---------------+---------+-----------+----------+-------------------+   +---------+---------------+---------+-----------+----------+--------------+ LEFT     CompressibilityPhasicitySpontaneityPropertiesThrombus Aging +---------+---------------+---------+-----------+----------+--------------+ CFV      Full           Yes      Yes                                 +---------+---------------+---------+-----------+----------+--------------+ SFJ      Full                                                        +---------+---------------+---------+-----------+----------+--------------+ FV Prox  Full                                                        +---------+---------------+---------+-----------+----------+--------------+ FV Mid   Partial                                      Acute          +---------+---------------+---------+-----------+----------+--------------+ FV DistalNone                                         Acute          +---------+---------------+---------+-----------+----------+--------------+ PFV      Full                                                        +---------+---------------+---------+-----------+----------+--------------+ POP      Full           No       No                                  +---------+---------------+---------+-----------+----------+--------------+ PTV      Full                                                        +---------+---------------+---------+-----------+----------+--------------+  PERO     Full                                                        +---------+---------------+---------+-----------+----------+--------------+ Gastroc  None                                         Acute           +---------+---------------+---------+-----------+----------+--------------+     Summary: RIGHT: - Findings consistent with acute deep vein thrombosis involving the right posterior tibial veins, right peroneal veins, and right gastrocnemius veins.  LEFT: - Findings consistent with acute deep vein thrombosis involving the left femoral vein, and left gastrocnemius veins.  *See table(s) above for measurements and observations. Electronically signed by Sherald Hess MD on 11/02/2019 at 3:01:38 PM.    Final    ECHOCARDIOGRAM LIMITED  Result Date: 11/22/2019    ECHOCARDIOGRAM REPORT   Patient Name:   Charles Dawson Date of Exam: 11/04/2019 Medical Rec #:  [id number]      Height:       68.0 in Accession #:    [id number]     Weight:       168.4 lb Date of Birth:  30-Dec-1955       BSA:          1.900 m Patient Age:    64 years       BP:           91/58 mmHg Patient Gender: M              HR:           80 bpm. Exam Location:  Inpatient Procedure: Limited Echo, Cardiac Doppler and Color Doppler Indications:    Acute Cornonary Syndrome I24.9  History:        Patient has no prior history of Echocardiogram examinations.                 Acute MI; Risk Factors:Former Smoker. COVID-19.  Sonographer:    Ross Ludwig RDCS (AE) Referring Phys: [id number] CHRISTOPHER D [facility]  Sonographer Comments: Echo performed with patient supine and on artificial respirator. Image acquisition challenging due to respiratory motion. Limited windows due to chest wall deformity. IMPRESSIONS  1. Left ventricular ejection fraction, by estimation, is 35 to 40%. The left ventricle has moderately decreased function. The left ventricle demonstrates regional wall motion abnormalities (see scoring diagram/findings for description). There is moderate left ventricular hypertrophy. Left ventricular diastolic parameters are consistent with Grade I diastolic dysfunction (impaired relaxation). There is severe hypokinesis of the left ventricular, mid-apical anterior  wall, anteroseptal wall and apical segment.  2. Right ventricular systolic function is mildly reduced. The right ventricular size is normal. There is normal pulmonary artery systolic pressure.  3. The mitral valve is abnormal. Trivial mitral valve regurgitation.  4. The aortic valve is tricuspid. Aortic valve regurgitation is not visualized.  5. Aortic dilatation noted. There is mild dilatation of the aortic root, measuring 39 mm.  6. The inferior vena cava is normal in size with greater than 50% respiratory variability, suggesting right atrial pressure of 3 mmHg. FINDINGS  Left Ventricle: Left ventricular ejection fraction, by estimation, is 35 to 40%. The left ventricle  has moderately decreased function. The left ventricle demonstrates regional wall motion abnormalities. Severe hypokinesis of the left ventricular, mid-apical anterior wall, anteroseptal wall and apical segment. The left ventricular internal cavity size was normal in size. There is moderate left ventricular hypertrophy. Left ventricular diastolic parameters are consistent with Grade I diastolic dysfunction (impaired relaxation). Indeterminate filling pressures. Right Ventricle: The right ventricular size is normal. No increase in right ventricular wall thickness. Right ventricular systolic function is mildly reduced. There is normal pulmonary artery systolic pressure. The tricuspid regurgitant velocity is 2.52 m/s, and with an assumed right atrial pressure of 3 mmHg, the estimated right ventricular systolic pressure is 28.4 mmHg. Left Atrium: Left atrial size was normal in size. Right Atrium: Right atrial size was normal in size. Pericardium: There is no evidence of pericardial effusion. Mitral Valve: The mitral valve is abnormal. There is mild thickening of the mitral valve leaflet(s). Trivial mitral valve regurgitation. Tricuspid Valve: The tricuspid valve is grossly normal. Tricuspid valve regurgitation is trivial. Aortic Valve: The aortic valve  is tricuspid. Aortic valve regurgitation is not visualized. Aortic valve mean gradient measures 3.0 mmHg. Aortic valve peak gradient measures 4.4 mmHg. Aortic valve area, by VTI measures 2.67 cm. Pulmonic Valve: The pulmonic valve was normal in structure. Pulmonic valve regurgitation is not visualized. Aorta: Aortic dilatation noted. There is mild dilatation of the aortic root, measuring 39 mm. Venous: The inferior vena cava is normal in size with greater than 50% respiratory variability, suggesting right atrial pressure of 3 mmHg. IAS/Shunts: No atrial level shunt detected by color flow Doppler.  LEFT VENTRICLE PLAX 2D LVIDd:         4.60 cm  Diastology LVIDs:         3.30 cm  LV e' medial:    3.99 cm/s LV PW:         1.30 cm  LV E/e' medial:  15.4 LV IVS:        1.20 cm  LV e' lateral:   5.29 cm/s LVOT diam:     2.10 cm  LV E/e' lateral: 11.6 LV SV:         50 LV SV Index:   26 LVOT Area:     3.46 cm  IVC IVC diam: 1.60 cm LEFT ATRIUM         Index LA diam:    3.90 cm 2.05 cm/m  AORTIC VALVE AV Area (Vmax):    3.01 cm AV Area (Vmean):   2.91 cm AV Area (VTI):     2.67 cm AV Vmax:           105.00 cm/s AV Vmean:          78.800 cm/s AV VTI:            0.188 m AV Peak Grad:      4.4 mmHg AV Mean Grad:      3.0 mmHg LVOT Vmax:         91.40 cm/s LVOT Vmean:        66.100 cm/s LVOT VTI:          0.145 m LVOT/AV VTI ratio: 0.77  AORTA Ao Root diam: 3.60 cm Ao Asc diam:  3.90 cm MITRAL VALVE               TRICUSPID VALVE MV Area (PHT): 3.65 cm    TR Peak grad:   25.4 mmHg MV Decel Time: 208 msec    TR Vmax:  252.00 cm/s MV E velocity: 61.30 cm/s MV A velocity: 69.00 cm/s  SHUNTS MV E/A ratio:  0.89        Systemic VTI:  0.14 m                            Systemic Diam: 2.10 cm Zoila Shutter MD Electronically signed by Zoila Shutter MD Signature Date/Time: 11/07/2019/2:01:26 PM    Final     Microbiology Recent Results (from the past 240 hour(s))  Blood Culture (routine x 2)     Status: None   Collection  Time: Nov 17, 2019 10:43 AM   Specimen: BLOOD  Result Value Ref Range Status   Specimen Description BLOOD LEFT ANTECUBITAL  Final   Special Requests   Final    BOTTLES DRAWN AEROBIC AND ANAEROBIC Blood Culture adequate volume   Culture   Final    NO GROWTH 5 DAYS Performed at [facility], [address] [place], [place] [address]    Report Status 11/16/2019 FINAL  Final  Blood Culture (routine x 2)     Status: Abnormal   Collection Time: 17-Nov-2019 10:46 AM   Specimen: BLOOD  Result Value Ref Range Status   Specimen Description BLOOD RIGHT ANTECUBITAL  Final   Special Requests   Final    BOTTLES DRAWN AEROBIC AND ANAEROBIC Blood Culture adequate volume   Culture  Setup Time   Final    GRAM POSITIVE COCCI IN CLUSTERS AEROBIC BOTTLE ONLY CRITICAL RESULT CALLED TO, READ BACK BY AND VERIFIED WITH: PHARMD K PIERCE [id number] AT 1203 BY CM    Culture (A)  Final    STAPHYLOCOCCUS EPIDERMIDIS THE SIGNIFICANCE OF ISOLATING THIS ORGANISM FROM A SINGLE SET OF BLOOD CULTURES WHEN MULTIPLE SETS ARE DRAWN IS UNCERTAIN. PLEASE NOTIFY THE MICROBIOLOGY DEPARTMENT WITHIN ONE WEEK IF SPECIATION AND SENSITIVITIES ARE REQUIRED. Performed at [facility], [address] [place], [place] [address]    Report Status 11/23/2019 FINAL  Final  Blood Culture ID Panel (Reflexed)     Status: Abnormal   Collection Time: 2019/11/17 10:46 AM  Result Value Ref Range Status   Enterococcus faecalis NOT DETECTED NOT DETECTED Final   Enterococcus Faecium NOT DETECTED NOT DETECTED Final   Listeria monocytogenes NOT DETECTED NOT DETECTED Final   Staphylococcus species DETECTED (A) NOT DETECTED Final    Comment: CRITICAL RESULT CALLED TO, READ BACK BY AND VERIFIED WITH: PHARMD K PIERCE [id number] AT 1203 BY CM    Staphylococcus aureus (BCID) NOT DETECTED NOT DETECTED Final   Staphylococcus epidermidis DETECTED (A) NOT DETECTED Final    Comment: CRITICAL RESULT CALLED TO, READ BACK BY AND VERIFIED WITH: PHARMD K  PIERCE [id number] AT 1203 BY CM    Staphylococcus lugdunensis NOT DETECTED NOT DETECTED Final   Streptococcus species NOT DETECTED NOT DETECTED Final   Streptococcus agalactiae NOT DETECTED NOT DETECTED Final   Streptococcus pneumoniae NOT DETECTED NOT DETECTED Final   Streptococcus pyogenes NOT DETECTED NOT DETECTED Final   A.calcoaceticus-baumannii NOT DETECTED NOT DETECTED Final   Bacteroides fragilis NOT DETECTED NOT DETECTED Final   Enterobacterales NOT DETECTED NOT DETECTED Final   Enterobacter cloacae complex NOT DETECTED NOT DETECTED Final   Escherichia coli NOT DETECTED NOT DETECTED Final   Klebsiella aerogenes NOT DETECTED NOT DETECTED Final   Klebsiella oxytoca NOT DETECTED NOT DETECTED Final   Klebsiella pneumoniae NOT DETECTED NOT DETECTED Final   Proteus species NOT DETECTED NOT DETECTED Final  Salmonella species NOT DETECTED NOT DETECTED Final   Serratia marcescens NOT DETECTED NOT DETECTED Final   Haemophilus influenzae NOT DETECTED NOT DETECTED Final   Neisseria meningitidis NOT DETECTED NOT DETECTED Final   Pseudomonas aeruginosa NOT DETECTED NOT DETECTED Final   Stenotrophomonas maltophilia NOT DETECTED NOT DETECTED Final   Candida albicans NOT DETECTED NOT DETECTED Final   Candida auris NOT DETECTED NOT DETECTED Final   Candida glabrata NOT DETECTED NOT DETECTED Final   Candida krusei NOT DETECTED NOT DETECTED Final   Candida parapsilosis NOT DETECTED NOT DETECTED Final   Candida tropicalis NOT DETECTED NOT DETECTED Final   Cryptococcus neoformans/gattii NOT DETECTED NOT DETECTED Final   Methicillin resistance mecA/C NOT DETECTED NOT DETECTED Final    Comment: Performed at [facility], [address] [place], [place] [address]  Respiratory Panel by RT PCR (Flu A&B, Covid) - Nasopharyngeal Swab     Status: Abnormal   Collection Time: 11/14/2019 10:47 AM   Specimen: Nasopharyngeal Swab  Result Value Ref Range Status   SARS Coronavirus 2 by RT PCR POSITIVE (A)  NEGATIVE Final    Comment: emailed L. Berdik RN 12:45 11/17/2019 ([email or web address]) (NOTE) SARS-CoV-2 target nucleic acids are DETECTED.  SARS-CoV-2 RNA is generally detectable in upper respiratory specimens  during the acute phase of infection. Positive results are indicative of the presence of the identified virus, but do not rule out bacterial infection or co-infection with other pathogens not detected by the test. Clinical correlation with patient history and other diagnostic information is necessary to determine patient infection status. The expected result is Negative.  Fact Sheet for Patients:  https://www.moore.com/  Fact Sheet for Healthcare Providers: https://www.young.biz/  This test is not yet approved or cleared by the [place] FDA and  has been authorized for detection and/or diagnosis of SARS-CoV-2 by FDA under an Emergency Use Authorization (EUA).  This EUA will remain in effect (meaning this test can be used) for the duration of  the COVID-19  declaration under Section 564(b)(1) of the Act, 21 U.S.C. section 360bbb-3(b)(1), unless the authorization is terminated or revoked sooner.      Influenza A by PCR NEGATIVE NEGATIVE Final   Influenza B by PCR NEGATIVE NEGATIVE Final    Comment: (NOTE) The Xpert Xpress SARS-CoV-2/FLU/RSV assay is intended as an aid in  the diagnosis of influenza from Nasopharyngeal swab specimens and  should not be used as a sole basis for treatment. Nasal washings and  aspirates are unacceptable for Xpert Xpress SARS-CoV-2/FLU/RSV  testing.  Fact Sheet for Patients: https://www.moore.com/  Fact Sheet for Healthcare Providers: https://www.young.biz/  This test is not yet approved or cleared by the [place] FDA and  has been authorized for detection and/or diagnosis of SARS-CoV-2 by  FDA under an Emergency Use Authorization (EUA). This EUA will remain  in  effect (meaning this test can be used) for the duration of the  Covid-19 declaration under Section 564(b)(1) of the Act, 21  U.S.C. section 360bbb-3(b)(1), unless the authorization is  terminated or revoked. Performed at [facility], [address] [place], [place] [address]   MRSA PCR Screening     Status: None   Collection Time: Dec 12, 2019 10:34 AM   Specimen: Nasal Mucosa; Nasopharyngeal  Result Value Ref Range Status   MRSA by PCR NEGATIVE NEGATIVE Final    Comment:        The GeneXpert MRSA Assay (FDA approved for NASAL specimens only), is one component of a comprehensive MRSA  colonization surveillance program. It is not intended to diagnose MRSA infection nor to guide or monitor treatment for MRSA infections. Performed at [facility], [address], [place], [place] [address]   Culture, respiratory (non-expectorated)     Status: None   Collection Time: 11/15/19  3:56 PM   Specimen: Tracheal Aspirate; Respiratory  Result Value Ref Range Status   Specimen Description TRACHEAL ASPIRATE  Final   Special Requests NONE  Final   Gram Stain   Final    MODERATE WBC PRESENT, PREDOMINANTLY PMN ABUNDANT GRAM POSITIVE COCCI MODERATE GRAM NEGATIVE COCCOBACILLI Performed at [facility], [address] [place], [place] [address]    Culture   Final    MODERATE STAPHYLOCOCCUS AUREUS MODERATE STREPTOCOCCUS PNEUMONIAE    Report Status 11/18/2019 FINAL  Final   Organism ID, Bacteria STAPHYLOCOCCUS AUREUS  Final   Organism ID, Bacteria STREPTOCOCCUS PNEUMONIAE  Final      Susceptibility   Staphylococcus aureus - MIC*    CIPROFLOXACIN <=0.5 SENSITIVE Sensitive     ERYTHROMYCIN <=0.25 SENSITIVE Sensitive     GENTAMICIN <=0.5 SENSITIVE Sensitive     OXACILLIN 0.5 SENSITIVE Sensitive     TETRACYCLINE <=1 SENSITIVE Sensitive     VANCOMYCIN <=0.5 SENSITIVE Sensitive     TRIMETH/SULFA <=10 SENSITIVE Sensitive     CLINDAMYCIN <=0.25 SENSITIVE Sensitive     RIFAMPIN  <=0.5 SENSITIVE Sensitive     Inducible Clindamycin NEGATIVE Sensitive     * MODERATE STAPHYLOCOCCUS AUREUS   Streptococcus pneumoniae - MIC*    ERYTHROMYCIN <=0.12 SENSITIVE Sensitive     LEVOFLOXACIN 0.5 SENSITIVE Sensitive     VANCOMYCIN 0.5 SENSITIVE Sensitive     PENO - penicillin <=0.06      PENICILLIN (non-meningitis) <=0.06 SENSITIVE Sensitive     PENICILLIN (oral) <=0.06 SENSITIVE Sensitive     CEFTRIAXONE (non-meningitis) <=0.12 SENSITIVE Sensitive     * MODERATE STREPTOCOCCUS PNEUMONIAE  Culture, blood (routine x 2)     Status: Abnormal   Collection Time: 11/15/19  4:18 PM   Specimen: BLOOD LEFT HAND  Result Value Ref Range Status   Specimen Description BLOOD LEFT HAND  Final   Special Requests   Final    BOTTLES DRAWN AEROBIC AND ANAEROBIC Blood Culture adequate volume   Culture  Setup Time   Final    GRAM POSITIVE COCCI IN PAIRS IN CHAINS IN BOTH AEROBIC AND ANAEROBIC BOTTLES CRITICAL RESULT CALLED TO, READ BACK BY AND VERIFIED WITH: Lana Fish PharmD 8:35 11/16/19 ([email or web address])    Culture (A)  Final    STREPTOCOCCUS PNEUMONIAE SUSCEPTIBILITIES PERFORMED ON PREVIOUS CULTURE WITHIN THE LAST 5 DAYS. Performed at [facility], [address] [place], [place] [address]    Report Status 11-28-19 FINAL  Final  Culture, blood (routine x 2)     Status: Abnormal   Collection Time: 11/15/19  4:22 PM   Specimen: BLOOD RIGHT HAND  Result Value Ref Range Status   Specimen Description BLOOD RIGHT HAND  Final   Special Requests   Final    BOTTLES DRAWN AEROBIC AND ANAEROBIC Blood Culture adequate volume   Culture  Setup Time   Final    GRAM POSITIVE COCCI IN PAIRS IN CHAINS CRITICAL VALUE NOTED.  VALUE IS CONSISTENT WITH PREVIOUSLY REPORTED AND CALLED VALUE. Performed at [facility], [address] [place], [place] [address]    Culture STREPTOCOCCUS PNEUMONIAE STAPHYLOCOCCUS AUREUS  (A)  Final   Report Status 11/20/2019 FINAL  Final   Organism ID, Bacteria  STREPTOCOCCUS PNEUMONIAE  Final   Organism ID, Bacteria STAPHYLOCOCCUS AUREUS  Final      Susceptibility   Staphylococcus aureus - MIC*    CIPROFLOXACIN <=0.5 SENSITIVE Sensitive     ERYTHROMYCIN <=0.25 SENSITIVE Sensitive     GENTAMICIN <=0.5 SENSITIVE Sensitive     OXACILLIN 0.5 SENSITIVE Sensitive     TETRACYCLINE <=1 SENSITIVE Sensitive     VANCOMYCIN 1 SENSITIVE Sensitive     TRIMETH/SULFA <=10 SENSITIVE Sensitive     CLINDAMYCIN <=0.25 SENSITIVE Sensitive     RIFAMPIN <=0.5 SENSITIVE Sensitive     Inducible Clindamycin NEGATIVE Sensitive     * STAPHYLOCOCCUS AUREUS   Streptococcus pneumoniae - MIC*    ERYTHROMYCIN <=0.12 SENSITIVE Sensitive     LEVOFLOXACIN 0.5 SENSITIVE Sensitive     VANCOMYCIN 0.5 SENSITIVE Sensitive     PENICILLIN (meningitis) <=0.06 SENSITIVE Sensitive     PENO - penicillin <=0.06      PENICILLIN (non-meningitis) <=0.06 SENSITIVE Sensitive     PENICILLIN (oral) <=0.06 SENSITIVE Sensitive     CEFTRIAXONE (non-meningitis) <=0.12 SENSITIVE Sensitive     CEFTRIAXONE (meningitis) <=0.12 SENSITIVE Sensitive     * STREPTOCOCCUS PNEUMONIAE  Blood Culture ID Panel (Reflexed)     Status: Abnormal   Collection Time: 11/15/19  4:22 PM  Result Value Ref Range Status   Enterococcus faecalis NOT DETECTED NOT DETECTED Final   Enterococcus Faecium NOT DETECTED NOT DETECTED Final   Listeria monocytogenes NOT DETECTED NOT DETECTED Final   Staphylococcus species NOT DETECTED NOT DETECTED Final   Staphylococcus aureus (BCID) NOT DETECTED NOT DETECTED Final   Staphylococcus epidermidis NOT DETECTED NOT DETECTED Final   Staphylococcus lugdunensis NOT DETECTED NOT DETECTED Final   Streptococcus species DETECTED (A) NOT DETECTED Final    Comment: CRITICAL RESULT CALLED TO, READ BACK BY AND VERIFIED WITH: Lana Fish PharmD 8:35 11/16/19 ([email or web address])    Streptococcus agalactiae NOT DETECTED NOT DETECTED Final   Streptococcus pneumoniae DETECTED (A) NOT DETECTED Final    Comment:  CRITICAL RESULT CALLED TO, READ BACK BY AND VERIFIED WITH: Lana Fish PharmD 8:35 11/16/19 ([email or web address])    Streptococcus pyogenes NOT DETECTED NOT DETECTED Final   A.calcoaceticus-baumannii NOT DETECTED NOT DETECTED Final   Bacteroides fragilis NOT DETECTED NOT DETECTED Final   Enterobacterales NOT DETECTED NOT DETECTED Final   Enterobacter cloacae complex NOT DETECTED NOT DETECTED Final   Escherichia coli NOT DETECTED NOT DETECTED Final   Klebsiella aerogenes NOT DETECTED NOT DETECTED Final   Klebsiella oxytoca NOT DETECTED NOT DETECTED Final   Klebsiella pneumoniae NOT DETECTED NOT DETECTED Final   Proteus species NOT DETECTED NOT DETECTED Final   Salmonella species NOT DETECTED NOT DETECTED Final   Serratia marcescens NOT DETECTED NOT DETECTED Final   Haemophilus influenzae NOT DETECTED NOT DETECTED Final   Neisseria meningitidis NOT DETECTED NOT DETECTED Final   Pseudomonas aeruginosa NOT DETECTED NOT DETECTED Final   Stenotrophomonas maltophilia NOT DETECTED NOT DETECTED Final   Candida albicans NOT DETECTED NOT DETECTED Final   Candida auris NOT DETECTED NOT DETECTED Final   Candida glabrata NOT DETECTED NOT DETECTED Final   Candida krusei NOT DETECTED NOT DETECTED Final   Candida parapsilosis NOT DETECTED NOT DETECTED Final   Candida tropicalis NOT DETECTED NOT DETECTED Final   Cryptococcus neoformans/gattii NOT DETECTED NOT DETECTED Final    Comment: Performed at [facility], [address], [place], [place] [address]  CSF culture     Status: None  Collection Time: 11/16/19  5:15 PM   Specimen: CSF; Cerebrospinal Fluid  Result Value Ref Range Status   Specimen Description CSF  Final   Special Requests NONE  Final   Gram Stain   Final    WBC PRESENT, PREDOMINANTLY MONONUCLEAR NO ORGANISMS SEEN CYTOSPIN SMEAR    Culture   Final    NO GROWTH 3 DAYS Performed at [facility], [address] [place], [place] [address]    Report Status 11/20/2019 FINAL  Final   Culture, blood (routine x 2)     Status: None (Preliminary result)   Collection Time: 11/18/19 11:10 AM   Specimen: BLOOD  Result Value Ref Range Status   Specimen Description BLOOD RIGHT ANTECUBITAL  Final   Special Requests   Final    BOTTLES DRAWN AEROBIC AND ANAEROBIC Blood Culture adequate volume   Culture   Final    NO GROWTH < 24 HOURS Performed at [facility], [address] [place], [place] [address]    Report Status PENDING  Incomplete  Culture, blood (routine x 2)     Status: None (Preliminary result)   Collection Time: 11/18/19 11:20 AM   Specimen: BLOOD RIGHT ARM  Result Value Ref Range Status   Specimen Description BLOOD RIGHT ARM  Final   Special Requests   Final    BOTTLES DRAWN AEROBIC AND ANAEROBIC Blood Culture adequate volume   Culture   Final    NO GROWTH < 24 HOURS Performed at [facility], [address] [place], [place] [address]    Report Status PENDING  Incomplete    Lab Basic Metabolic Panel: Recent Labs  Lab 11/14/19 0500 11/14/19 1204 11/15/19 0433 11/16/19 0015 11/16/19 0417 11/16/19 0506 11/17/19 0546 11/18/19 0354 11/18/19 0436 11/18/19 0436 10/29/2019 [id number] 10/29/2019 [id number] 11/15/2019 0334 11/06/2019 0522 10/28/2019 0815 11/18/2019 1133 11/07/2019 1613  NA 141   < > 143   < > 145   < > 145   < > 146*   < > 148*  --  150* 150* 148* 150*  --   K 4.8   < > 5.1   < > 5.1   < > 4.9   < > 5.6*   < > 6.2*   < > 6.4* 6.4* 6.5* 6.7* 6.7*  CL 107  --  110  --  112*   < > 108  --  107  --  110  --   --  110 108 111  --   CO2 22  --  25  --  21*   < > 23  --  27  --  27  --   --  28 27 27   --   GLUCOSE 170*  --  120*  --  106*   < > 114*  --  140*  --  179*  --   --  139* 137* 147*  --   BUN 40*  --  53*  --  79*   < > 108*  --  144*  --  158*  --   --  155* 158* 158*  --   CREATININE 1.50*  --  1.87*  --  2.87*   < > 3.30*  --  3.92*  --  4.01*  --   --  3.96* 3.88* 3.93*  --   CALCIUM 6.9*  --  7.3*  --  6.7*   < > 6.7*  --  6.8*  --  7.2*  --   --  7.4* 7.4* 7.6*  --   MG 2.4  --  2.6*  --  2.7*  --  2.8*  --   --   --   --   --   --   --   --   --   --   PHOS 5.4*  --  4.4  --  5.9*  --  5.9*  --   --   --   --   --   --   --   --   --   --    < > = values in this interval not displayed.   Liver Function Tests: Recent Labs  Lab 11/14/19 0500 11/15/19 0433 11/16/19 0417  AST 75* 51* 47*  ALT 32 29 23  ALKPHOS 75 69 79  BILITOT 0.5 0.6 1.0  PROT 5.3* 5.1* 5.1*  ALBUMIN 1.7* 1.7* 1.4*   No results for input(s): LIPASE, AMYLASE in the last 168 hours. No results for input(s): AMMONIA in the last 168 hours. CBC: Recent Labs  Lab 11/14/19 0500 11/14/19 1204 11/15/19 0433 11/16/19 0015 11/16/19 0417 11/16/19 0506 11/17/19 0445 11/17/19 1200 11/18/19 0354 11/18/19 0436 12/14/2019 0334  WBC 20.4*  --  12.7*  --  12.8*  --  10.3  --   --  14.9*  --   NEUTROABS 18.0*  --  10.8*  --  11.9*  --   --   --   --   --   --   HGB 11.2*   < > 10.4*   < > 9.4*   < > 7.6* 7.8* 7.1* 7.6* 8.5*  HCT 35.0*   < > 32.9*   < > 29.9*   < > 23.7* 24.0* 21.0* 23.7* 25.0*  MCV 99.4  --  99.7  --  101.7*  --  97.9  --   --  97.1  --   PLT 163  --  222  --  262  --  199  --   --  202  --    < > = values in this interval not displayed.   Cardiac Enzymes: Recent Labs  Lab 11/16/19 1230  CKTOTAL 137   Sepsis Labs: Recent Labs  Lab 10/28/2019 1029 11/14/19 0500 11/15/19 0433 11/16/19 0417 11/17/19 0445 11/18/19 0436  WBC  --    < > 12.7* 12.8* 10.3 14.9*  LATICACIDVEN 3.0*  --   --   --   --   --    < > = values in this interval not displayed.    Procedures/Operations     [employer] 11/20/2019, 10:23 AM

## 2019-11-28 NOTE — Progress Notes (Signed)
Spoke with patient's nephew who is a healthcare proxy for this patient and explained patient is going into multiorgan failure with refractory hyperkalemia. Patient's brother Mr. Richarda Blade and patient's nephew Mr. Coy Rochford came to visit the patient, at bedside after explaining that patient is slowly going into multiorgan failure with refractory hyperkalemia, they decided to proceed with DNR DNI and comfort measures with palliative extubation considering patient's prognosis is poor.  Comfort care orders have been placed, patient will be palliatively extubated.   Cheri Fowler MD Critical care physician [facility]  Pager: [phone] Mobile: [phone]

## 2019-11-28 NOTE — Progress Notes (Signed)
CRITICAL VALUE ALERT  Critical Value:  Potassium 6.4  Date & Time Notied:  11/16/2019 6:44 AM  Provider Notified: E-link  Orders Received/Actions taken: Awaiting orders

## 2019-11-28 NOTE — Progress Notes (Signed)
Pt deceased.  Wasted 75 mL of Fentanyl & 10 mL of Versed. Lincoln Brigham, RN witnessed the waste.

## 2019-11-28 NOTE — Procedures (Signed)
Extubation Procedure Note  Palliative extubation   Patient Details:   Name: Charles Dawson DOB: 08/17/1955 MRN: [id number]   Airway Documentation:    Vent end date: 11/11/2019 Vent end time: 2030   Evaluation  Lianne Bushy 11/15/2019, 8:43 PM

## 2019-11-28 NOTE — Progress Notes (Addendum)
NAME:  Charles Dawson, MRN:  [id number], DOB:  [birth year], LOS: 8 ADMISSION DATE:  11/12/2019, CONSULTATION DATE: 12-05-19 REFERRING MD: Internal medicine residency service, CHIEF COMPLAINT: Patient with COVID-19  Brief History   Patient is a 64 year old admitted with acute hypoxic respiratory failure due to COVID-19 pneumonia and acute NSTEMI, underwent cardiac cath not amenable to angioplasty    Past Medical History   Past Medical History:  Diagnosis Date  . Anxiety   . 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  He became hyperkalemic  Objective   Blood pressure 130/61, pulse 95, temperature 98 F (36.7 C), temperature source Axillary, resp. rate (!) 28, height 5\' 8"  (1.727 m), weight 87.5 kg, SpO2 (!) 89 %. CVP:  [7 mmHg-15 mmHg] 8 mmHg  Vent Mode: PRVC FiO2 (%):  [50 %-70 %] 70 % Set Rate:  [28 bmp] 28 bmp Vt Set:  [550 mL] 550 mL PEEP:  [8 cmH20] 8 cmH20 Plateau Pressure:  [21 cmH20-26 cmH20] 21 cmH20   Intake/Output Summary (Last 24 hours) at 11/02/2019 0903 Last data filed at 11/10/2019 0641 Gross per 24 hour  Intake 2239.76 ml  Output 3135 ml  Net -895.24 ml   Filed Weights   11/17/19 0500 11/18/19 0400 11/15/2019 0349  Weight: 89.7 kg 87.5 kg 87.5 kg   CVP:  [7 mmHg-15 mmHg] 8 mmHg  Examination: General: Elderly Caucasian male, orally intubated, lying on the bed HENT: Atraumatic, normocephalic ET tube and OG tube in place. Lungs: Bilateral mechanical breath sounds, no wheezes or rhonchi Cardiovascular: Regular rate and rhythm, no murmur Abdomen: Benign bowel sounds positive abdomen is soft, tolerating tube feeds Extremities: No edema Neuro: Sedated no  response to painful stimuli. GU: Catheter in place Skin: No rash  Resolved Hospital Problem list   Undifferentiated, mixed septic/cardiogenic shock  Assessment & Plan:  Acute hypoxic/hypercapnic respiratory failure due to ARDS from COVID-19 pneumonia Pneumococcal/MSSA pneumonia Continue mechanical ventilation per ARDS protocol Target TVol 6 cc/kgIBW Patient tolerated pressure support trial yesterday for few hours but later in the evening he became hypoxic, dyssynchronous with the vent with increasing FiO2 requirement Currently on 70% FiO2 with O2 sat 88% Minimize sedation with RASS goal of -1 to -2 Titrate FiO2 with goal O2 sat 88-92% Continue with dexamethasone to complete 10 days therapy   ST elevation MI-not amenable to percutaneous revascularization. Cardiology signed off suggesting patient will need single-vessel bypass surgery once he gets medically stable Continue Aspirin, metoprolol and atorvastatin Echo revealed EF of 35 to 40% Currently unable to start him on ACE inhibitor due to AKI, will reevaluate every day  Pneumococcal bacteremia with severe sepsis Continue IV ceftriaxone  Possible alcohol withdrawal alcohol withdrawal, however may have been symptoms of myocardial infarction. Continue enteral benzodiazepines, Seroquel, and oxycodone. Start weaning fentanyl first and then Versed infusion  Acute deep vein thrombosis involving the left femoral vein, and left gastrocnemius veins.  -Continue IV heparin.  AKI Patient serum creatinine is stable, he is in net negative fluid balance for last couple of days Holding Lasix for now for now Closely monitor serum creatinine  Hyperkalemia/hypernatremia Patient serum potassium went up to 6.4, required treatment with D5/insulin Started on [facility] repeat serum potassium later today, if it's trending up  we'll ask nephrology consult for possible dialysis considering patient has AKI and refractory hyperkalemia Started on  free water via G-tube  Anemia of critical illness Patient's hemoglobin is stable 7-8  Severe malnutrition Continue nutritional supplement Dietitian consult is requested  Best practice:  Diet: Tolerating tube feeds Pain/Anxiety/Delirium protocol (if indicated): midazolam and fentanyl. Target RASS -1 to -2  VAP protocol (if indicated): Yes DVT prophylaxis: IV heparin for active DVT GI prophylaxis: Pepcid Glucose control: Stress hyperglycemia well controlled on sliding scale insulin alone. Mobility: Bedrest Code Status: Full code Family Communication: We will update family today Disposition: ICU  Labs   CBC: Recent Labs  Lab 11/24/2019 0500 10/29/2019 0516 11/14/19 0500 11/14/19 1204 11/15/19 0433 11/16/19 0015 11/16/19 0417 11/16/19 0506 11/17/19 0445 11/17/19 1200 11/18/19 0354 11/18/19 0436 10/30/2019 0334  WBC 19.2*   < > 20.4*  --  12.7*  --  12.8*  --  10.3  --   --  14.9*  --   NEUTROABS 16.4*  --  18.0*  --  10.8*  --  11.9*  --   --   --   --   --   --   HGB 15.2   < > 11.2*   < > 10.4*   < > 9.4*   < > 7.6* 7.8* 7.1* 7.6* 8.5*  HCT 45.5   < > 35.0*   < > 32.9*   < > 29.9*   < > 23.7* 24.0* 21.0* 23.7* 25.0*  MCV 94.2   < > 99.4  --  99.7  --  101.7*  --  97.9  --   --  97.1  --   PLT 204   < > 163  --  222  --  262  --  199  --   --  202  --    < > = values in this interval not displayed.    Basic Metabolic Panel: Recent Labs  Lab 10/31/2019 0500 11/05/2019 0516 11/14/19 0500 11/14/19 1204 11/15/19 0433 11/16/19 0015 11/16/19 0417 11/16/19 0506 11/16/19 1730 11/17/19 0434 11/17/19 0546 11/17/19 0546 11/18/19 0354 11/18/19 0436 11/16/2019 0333 11/09/2019 0334 11/09/2019 0522  NA 137   < > 141   < > 143   < > 145   < > 144   < > 145   < > 147* 146* 148* 150* 150*  K 3.5   < > 4.8   < > 5.1   < > 5.1   < > 4.9   < > 4.9   < > 5.4* 5.6* 6.2* 6.4* 6.4*  CL 101   < > 107  --  110  --  112*   < > 110  --  108  --   --  107 110  --  110  CO2 26   < > 22  --  25   --  21*   < > 24  --  23  --   --  27 27  --  28  GLUCOSE 132*   < > 170*  --  120*  --  106*   < > 142*  --  114*  --   --  140* 179*  --  139*  BUN 20   < > 40*  --  53*  --  79*   < > 93*  --  108*  --   --  144* 158*  --  155*  CREATININE 0.66   < >  1.50*  --  1.87*  --  2.87*   < > 3.09*  --  3.30*  --   --  3.92* 4.01*  --  3.96*  CALCIUM 7.9*   < > 6.9*  --  7.3*  --  6.7*   < > 6.7*  --  6.7*  --   --  6.8* 7.2*  --  7.4*  MG 2.1  --  2.4  --  2.6*  --  2.7*  --   --   --  2.8*  --   --   --   --   --   --   PHOS 3.9  --  5.4*  --  4.4  --  5.9*  --   --   --  5.9*  --   --   --   --   --   --    < > = values in this interval not displayed.   GFR: Estimated Creatinine Clearance: 19.7 mL/min (A) (by C-G formula based on SCr of 3.96 mg/dL (H)). Recent Labs  Lab 11/14/2019 0513 11/18/2019 1029 11/14/19 0500 11/15/19 0433 11/16/19 0417 11/17/19 0445 11/18/19 0436  WBC  --   --    < > 12.7* 12.8* 10.3 14.9*  LATICACIDVEN 3.2* 3.0*  --   --   --   --   --    < > = values in this interval not displayed.    Liver Function Tests: Recent Labs  Lab 11/22/2019 0500 11/14/19 0500 11/15/19 0433 11/16/19 0417  AST 77* 75* 51* 47*  ALT 34 32 29 23  ALKPHOS 107 75 69 79  BILITOT 0.9 0.5 0.6 1.0  PROT 5.6* 5.3* 5.1* 5.1*  ALBUMIN 2.2* 1.7* 1.7* 1.4*   No results for input(s): LIPASE, AMYLASE in the last 168 hours. No results for input(s): AMMONIA in the last 168 hours.  ABG    Component Value Date/Time   PHART 7.299 (L) 12/07/19 0334   PCO2ART 61.1 (H) December 07, 2019 0334   PO2ART 58 (L) 12/07/19 0334   HCO3 30.1 (H) 12-07-19 0334   TCO2 32 2019/12/07 0334   ACIDBASEDEF 2.0 11/16/2019 0506   O2SAT 86.0 12-07-19 0334     Coagulation Profile: Recent Labs  Lab 11/20/2019 0500  INR 1.5*   HbA1C: No results found for: HGBA1C  CBG: Recent Labs  Lab 11/18/19 1910 11/18/19 2328 12/07/2019 0322 12-07-2019 0738 December 07, 2019 0825  GLUCAP 155* 172* 181* 115* 192*   Cardiac  Panel (last 3 results) Recent Labs    11/16/19 1230  CKTOTAL 137   Total critical care time: 41 minutes  Performed by: Cheri Fowler   Critical care time was exclusive of separately billable procedures and treating other patients.   Critical care was necessary to treat or prevent imminent or life-threatening deterioration.   Critical care was time spent personally by me on the following activities: development of treatment plan with patient and/or surrogate as well as nursing, discussions with consultants, evaluation of patient's response to treatment, examination of patient, obtaining history from patient or surrogate, ordering and performing treatments and interventions, ordering and review of laboratory studies, ordering and review of radiographic studies, pulse oximetry and re-evaluation of patient's condition.   Cheri Fowler MD Critical care physician [facility]  Pager: [phone] Mobile: [phone]

## 2019-11-28 NOTE — Progress Notes (Signed)
eLink Physician-Brief Progress Note Patient Name: Charles Dawson DOB: March 24, 1955 MRN: [id number]   Date of Service  10/30/2019  HPI/Events of Note  K+ 6.2  eICU Interventions  Hyperkalemia orders entered.        [name] Egidio Lofgren 11/09/2019, 5:19 AM

## 2019-11-28 NOTE — Progress Notes (Addendum)
ANTICOAGULATION CONSULT NOTE  Pharmacy Consult for heparin Indication: DVT and CAD  Labs: Recent Labs    11/16/19 1230 11/16/19 1430 11/16/19 1730 11/17/19 0445 11/17/19 0445 11/17/19 0546 11/17/19 1200 11/18/19 0354 11/18/19 0436 11/18/19 0436 11/18/19 0712 11/18/19 1730 11/26/2019 0333 11/10/2019 0334  HGB  --    < >  --  7.6*   < >  --    < > 7.1* 7.6*  --   --   --   --  8.5*  HCT  --    < >  --  23.7*  --   --    < > 21.0* 23.7*  --   --   --   --  25.0*  PLT  --   --   --  199  --   --   --   --  202  --   --   --   --   --   HEPARINUNFRC  --    < >  --  0.40   < >  --   --   --  1.14*   < > 1.12* 1.30* 1.10*  --   CREATININE  --    < >   < >  --   --  3.30*  --   --  3.92*  --   --   --  4.01*  --   CKTOTAL 137  --   --   --   --   --   --   --   --   --   --   --   --   --    < > = values in this interval not displayed.    Assessment: 64 y.o. male with CAD and LLE DVT for heparin.  Heparin level remains high.  Goal of Therapy:  Heparin level 0.3-0.7 units/ml   Plan:  Hold heparin x 2 hours, then restart heparin  1200 units/hr Check heparin level in 8 hours.    Geannie Risen, PharmD, BCPS  11/06/2019,5:52 AM

## 2019-11-28 NOTE — Consult Note (Signed)
Renal Service Consult Note [facility]  Charles Dawson 2019-12-01 Charles Krabbe, MD Requesting Physician:  Dr Merrily Pew  Reason for Consult: Renal failure, hyperkalemia  HPI: The patient is a 64 y.o. year-old w/ hx of COPD and anxiety presented on 11/17/2019 w/ SOB and SpO2 32% on RA at home per EMS.  Pt had COVID + pna and was admitted. He was intubated on 10/28/2019. Had heart cath on 10/17 which showed some moderate disease and 1 tight stenosis 90% in the mid LAD.  Pt now has progressive renal failure, creat up from 0.73 on admit to 1.87 on 10/19 > 3.30 on 10/21 and 3.93 today w/ BUN 158 and K+ 6.7.  Pt was on pressors epi gtt from 10/17- 19 , levo gtt from 10/17- 10/20 and vaso gtt from 10/19- 10/21. Has rec'd IV ins/ D50 today and has been getting lokelma per NG tube for high K+. UOP has been > 1L daily and was > 3 L the last 2 days. Has been off pressors the last 2.5 days. Creat appears to be leveling off in the high 3's the last 48hrs. Asked to see for renal failure.   Pt on vent in ICU, no hx obtained.   Past Medical History  Past Medical History:  Diagnosis Date  . Anxiety   . Empyema (HCC) 12/27/2011   Past Surgical History  Past Surgical History:  Procedure Laterality Date  . DENTAL SURGERY    . LEFT HEART CATH AND CORONARY ANGIOGRAPHY N/A 11/24/2019   Procedure: LEFT HEART CATH AND CORONARY ANGIOGRAPHY;  Surgeon: Charles Hazel, MD;  Location: [facility] INVASIVE CV LAB;  Service: Cardiovascular;  Laterality: N/A;  . VIDEO ASSISTED THORACOSCOPY (VATS)/EMPYEMA  12/27/2011   Procedure: VIDEO ASSISTED THORACOSCOPY (VATS)/EMPYEMA;  Surgeon: Charles Nails, MD;  Location: [facility] OR;  Service: Thoracic;  Laterality: Left;  Marland Kitchen VIDEO BRONCHOSCOPY  12/27/2011   Procedure: VIDEO BRONCHOSCOPY;  Surgeon: Charles Nails, MD;  Location: [facility] OR;  Service: Thoracic;  Laterality: N/A;   Family History No family history on file. Social History  reports that he has quit smoking. He  does not have any smokeless tobacco history on file. He reports current alcohol use of about 84.0 standard drinks of alcohol per week. He reports current drug use. Allergies No Known Allergies Home medications Prior to Admission medications   Medication Sig Start Date End Date Taking? Authorizing Provider  Aspirin-Acetaminophen-Caffeine (GOODY HEADACHE PO) Take 1 packet by mouth as needed (headache.).   Yes [provider]  ALPRAZolam Prudy Feeler) 0.5 MG tablet Take 0.5 mg by mouth 3 (three) times daily. Patient not taking: Reported on 10/31/2019    [provider]  aspirin 81 MG chewable tablet Chew 324 mg by mouth once. Patient not taking: Reported on 11/17/2019    [provider]  oxyCODONE-acetaminophen (PERCOCET) 7.5-325 MG per tablet Take 1 tablet by mouth every 8 (eight) hours as needed for pain. Patient not taking: Reported on 11/16/2019 01/26/12   Charles Pali, PA-C     Vitals:   12/01/19 1132 2019/12/01 1200 01-Dec-2019 1300 12/01/19 1400  BP:  115/83 113/85 117/83  Pulse:  99 98 100  Resp:      Temp: 99 F (37.2 C)     TempSrc: Oral     SpO2:  90% 90% 90%  Weight:      Height:       Exam on vent ,sedated  no jvd  throat ett in place  Chest  cta bilat and lat, occ rhonchi  Cor reg no RG  Abd soft ntnd no ascites   Ext diffuse 2+ leg and UE edema   Neuro on vent and sedated, no following commands     Home meds:  - xanax prn/ asa / goody prn/ percocet prn    UNa 66 on 10/23    UA pend     UCr pend     Renal US pend     CXR 10/23 - IMPRESSION: 1. Continued confluent bilateral pneumonia. 2. Increased hazy density at the bases which may be pleural fluid. 3. Unremarkable hardware positioning.     Labs - Na 150  K 6.7  CO2 27  BUN 158  Cr 3.93  Ca 7.6        IV contrast 100 cc w/ CTA chest on 11/12/19   Assessment/ Plan: 1. Renal failure - ATN nonoliguric due to shock +/- contrast. Shock has resolved > 48hrs, UOP good, creat appears may be  at plateau around 3.8- 3.9.  Main issue is high K+ , will hold TF"s for now. There is a family meeting tonight.  No indication for acute RRT at this time. Can make decision later after fam meeting and repeat K+.  2. Hyperkalemia - getting lokelma, next would be kayexalate. Will hold non-renal TF's which are not low in K+.  3. COVID PNA - on vent, sig CXR changes 4. Shock - related to #3, resolved at this time, off pressors.  5. H/o COPD      Charles Caeli Linehan  MD 2019-11-27, 3:34 PM  Recent Labs  Lab 11/17/19 0445 11/17/19 1200 11/18/19 0436 2019-11-27 0334  WBC 10.3  --  14.9*  --   HGB 7.6*   < > 7.6* 8.5*   < > = values in this interval not displayed.   Recent Labs  Lab 11/16/19 0417 11/16/19 0506 11/17/19 0546 11/18/19 0354 11/27/2019 0815 November 27, 2019 1133  K 5.1   < > 4.9   < > 6.5* 6.7*  BUN 79*   < > 108*   < > 158* 158*  CREATININE 2.87*   < > 3.30*   < > 3.88* 3.93*  CALCIUM 6.7*   < > 6.7*   < > 7.4* 7.6*  PHOS 5.9*  --  5.9*  --   --   --    < > = values in this interval not displayed.

## 2019-11-28 NOTE — Progress Notes (Signed)
eLink Physician-Brief Progress Note Patient Name: Charles Dawson DOB: 15-Mar-1955 MRN: [id number]   Date of Service  30-Nov-2019  HPI/Events of Note  K+ 6.4  eICU Interventions  Adult focused hyperkalemia orders entered.        [name] [name] 2019/11/30, 7:02 AM

## 2019-11-28 NOTE — Progress Notes (Signed)
ANTICOAGULATION CONSULT NOTE   Pharmacy Consult for heparin Indication: BLE DVT  Assessment: 64yo male had been admitted 10/15 for Covid PNA, decompensated overnight and was tx'd to the ICU and intubated, ECG reveals STEMI, to start heparin while awaiting transfer to the cath lab. Patient went to cath lab, pharmacy was reconsulted to start heparin 8 hours after sheath pulled.  [place] 10/17 revealed BLE DVTs. Heparin was held for~10 hours for lumbar puncture 10/20.   Heparin level came back still slightly elevated at 0.73, on 1200 units/hr. Pt now having bloody/rust colored BM and bleeding from mouth - heparin infusion has been stopped. Hgb 8.5, plt 202 on last check 10/22.    Goal of Therapy:  Heparin level 0.3-0.7 units/ml Monitor platelets by anticoagulation protocol: Yes   Plan:  Continue to hold heparin given bleeding F/u with team for plans if restarting pending family discussion Monitor for s/sx of bleeding  Thanks for allowing pharmacy to be a part of this patient's care.  Sherron Monday, PharmD, BCCCP Clinical Pharmacist  Phone: [phone] Nov 25, 2019 5:28 PM  Please check AMION for all [facility] Pharmacy phone numbers After 10:00 PM, call Main Pharmacy [phone]

## 2019-11-28 DEATH — deceased

## 2021-06-03 IMAGING — CT CT ANGIO CHEST
2 of 6 series · 18 of 46 positions shown · IV contrast (omnipaque)
Comparison: Chest radiograph dated 11/11/2019

CLINICAL DATA: Shortness of breath, positive D-dimer

EXAM:
CT ANGIOGRAPHY CHEST WITH CONTRAST
TECHNIQUE: Multidetector CT imaging of the chest was performed using the
standard protocol during bolus administration of intravenous
contrast. Multiplanar CT image reconstructions and MIPs were
obtained to evaluate the vascular anatomy.
CONTRAST:  100mL OMNIPAQUE IOHEXOL 350 MG/ML SOLN

[Series 6: thins · axial · 0.76mm/px · z∈[+1105,+1411]mm · 15 of 336 slices shown]
[im 15/336  lung]
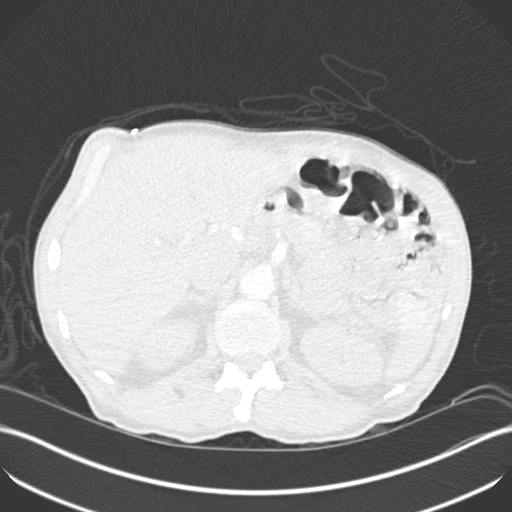
[im 44/336  soft-tissue]
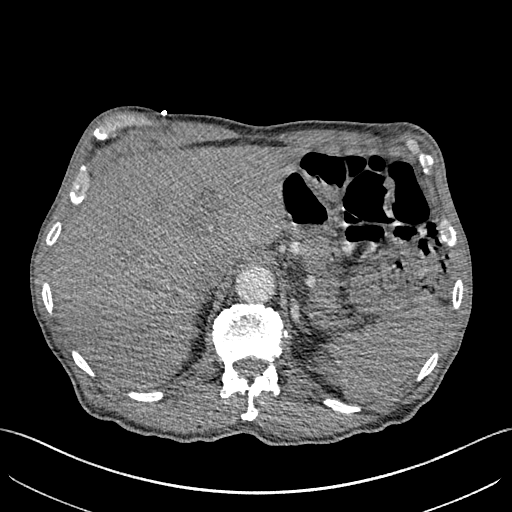
[im 59/336  lung]
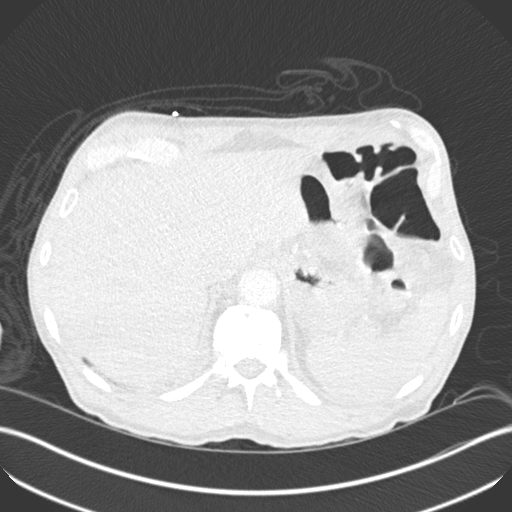
[im 88/336  soft-tissue]
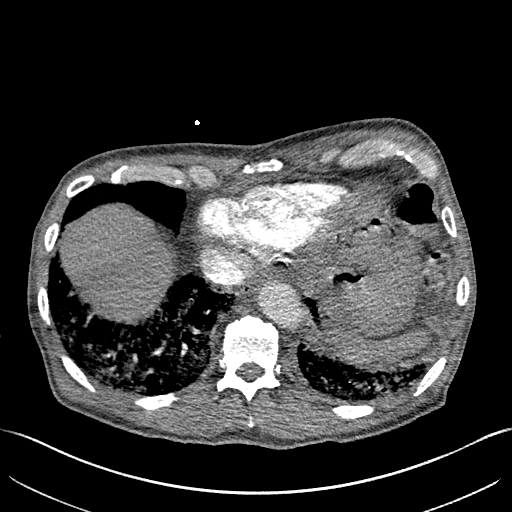
[im 102/336  lung]
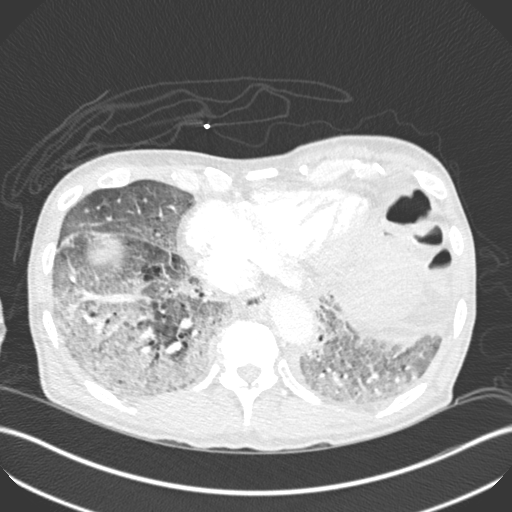
[im 132/336  soft-tissue]
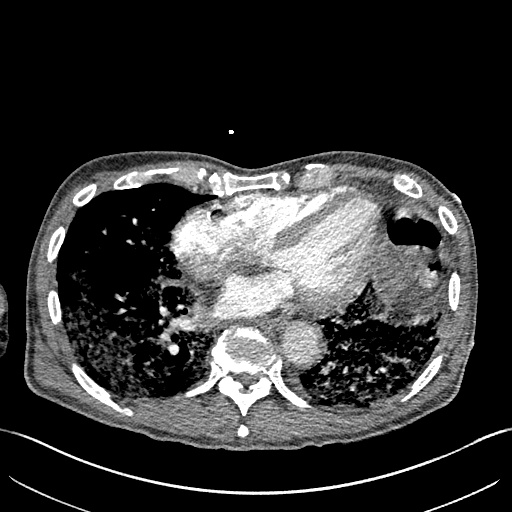
[im 146/336  lung]
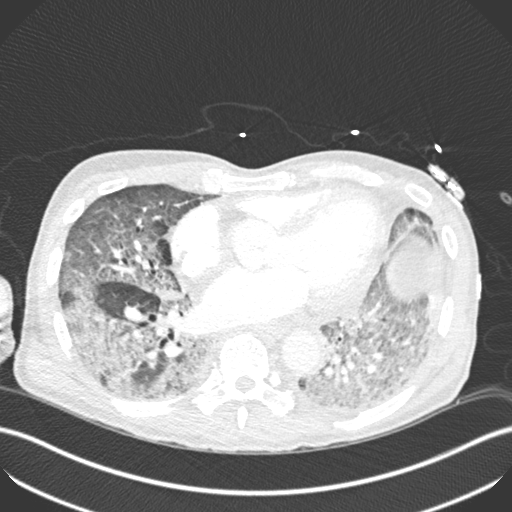
[im 175/336  soft-tissue]
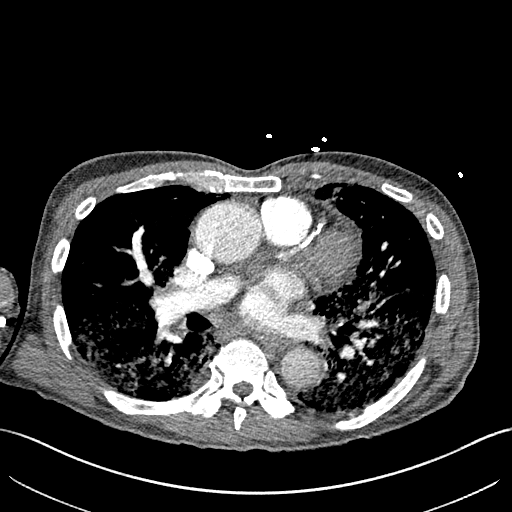
[im 190/336  lung]
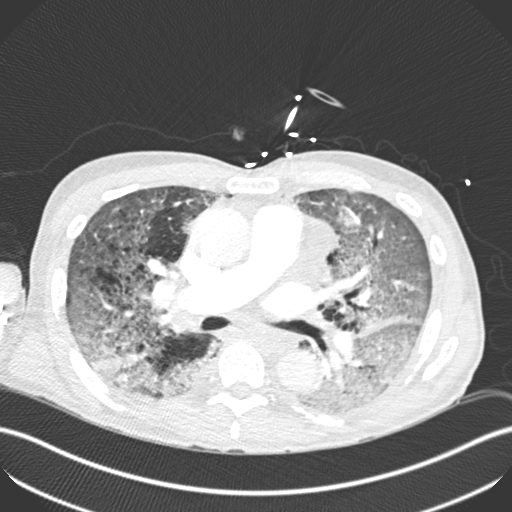
[im 204/336  soft-tissue]
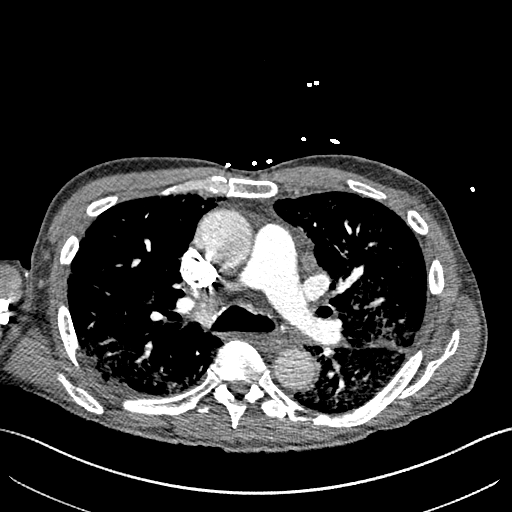
[im 234/336  lung]
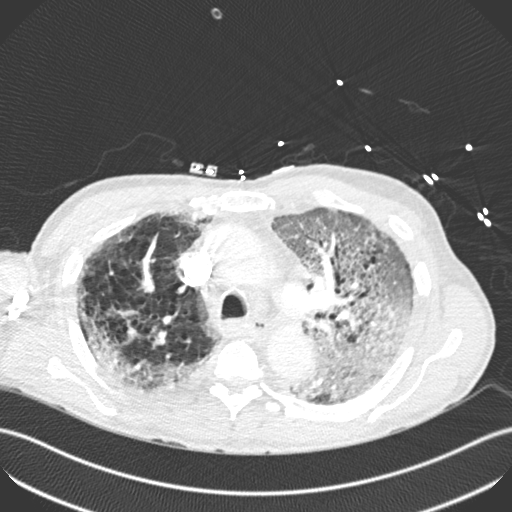
[im 248/336  soft-tissue]
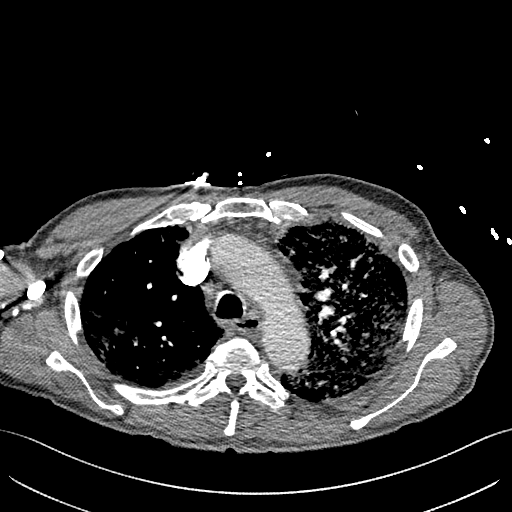
[im 277/336  lung]
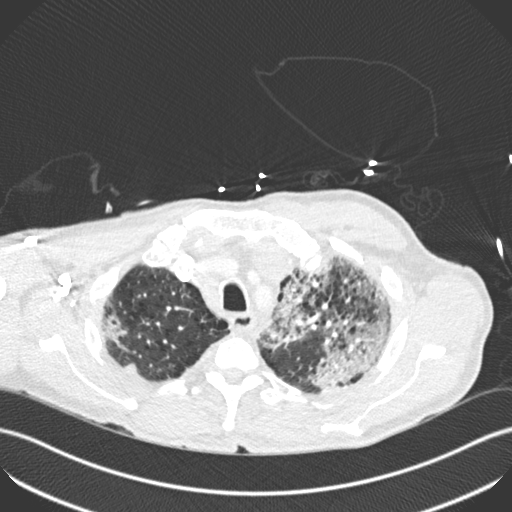
[im 292/336  soft-tissue]
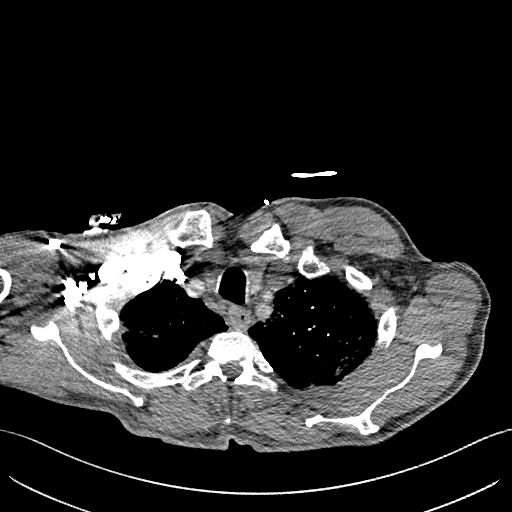
[im 321/336  lung]
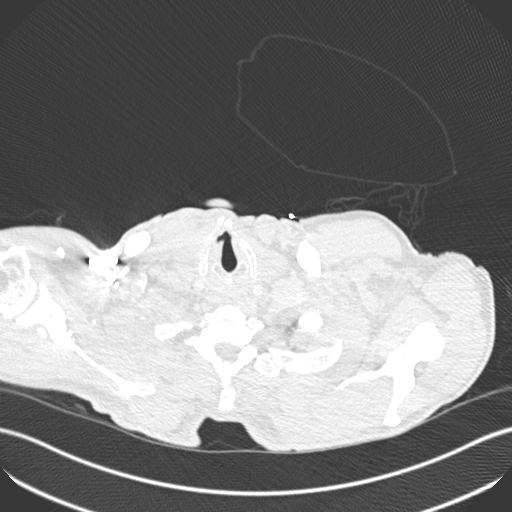

[Series 8: coronal mpr · coronal · 0.66mm/px · 3 of 148 slices shown]
[im 37/148  soft-tissue]
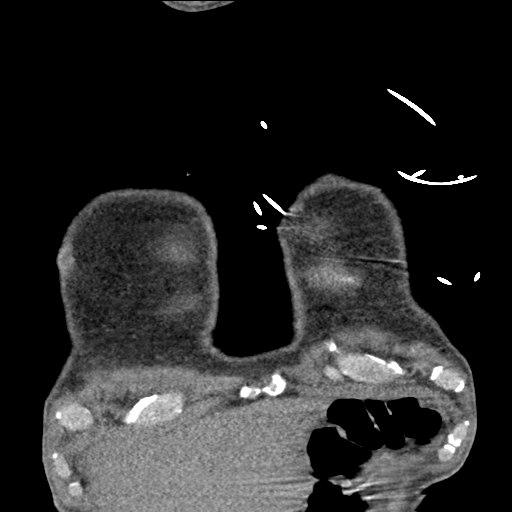
[im 74/148  soft-tissue]
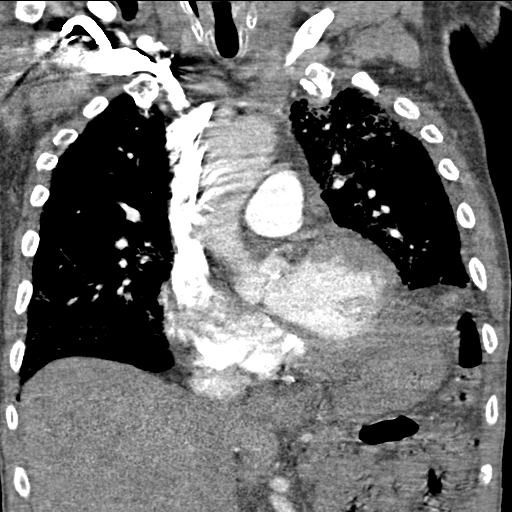
[im 111/148  soft-tissue]
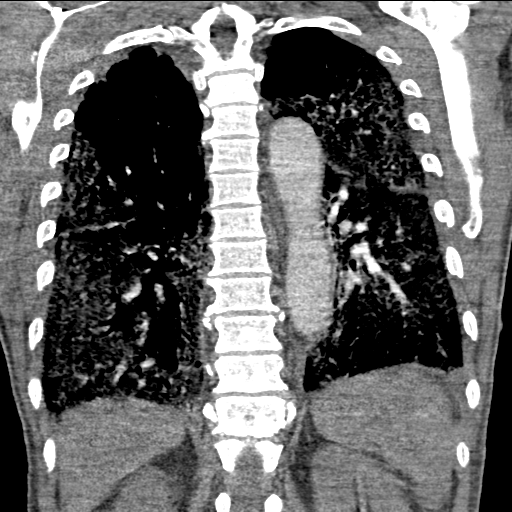

[18 of 46 positions shown; findings below may reference images not displayed]

FINDINGS: Cardiovascular: Satisfactory opacification of the bilateral
pulmonary arteries to the lobar level. Evaluation is constrained by
respiratory motion. No evidence of pulmonary embolism.

Although not tailored for evaluation of the thoracic aorta, there is
no evidence of thoracic aortic aneurysm or dissection. Mild
atherosclerotic calcifications aortic root.

The heart is normal in size.  No pericardial effusion.

Mild coronary atherosclerosis of the LAD and right coronary artery.

Mediastinum/Nodes: Small mediastinal lymph nodes which do not meet
pathologic CT size criteria.

Visualized thyroid is unremarkable.

Lungs/Pleura: Multifocal/diffuse ground-glass opacities in the lungs
bilaterally, with relative sparing of the lung apices (right greater
than left), and a slight subpleural/peripheral predominance in the
upper lobes. No associated interlobular septal thickening. No
associated pleural effusions. This appearance favors multifocal
pneumonia on the basis of atypical/viral infection such as COVID
rather than interstitial edema.

Although evaluation is constrained by motion degradation, there are
no suspicious pulmonary nodules.

Mild centrilobular and paraseptal emphysematous changes at the lung
apices.

No pneumothorax.

Upper Abdomen: Visualized upper abdomen is grossly unremarkable.

Musculoskeletal: Degenerative changes of the visualized
thoracolumbar spine.

Review of the MIP images confirms the above findings.
IMPRESSION: No evidence of pulmonary embolism.

Multifocal pneumonia, suspicious for COVID.

Aortic Atherosclerosis (AX05Q-PAD.D) and Emphysema (AX05Q-C96.S).

## 2021-06-10 IMAGING — DX DG CHEST 1V PORT
1 series · 1 of 1 positions shown · non-contrast
Comparison: Six days ago

CLINICAL DATA: Acute respiratory failure.

EXAM:
PORTABLE CHEST 1 VIEW

[chest ap]
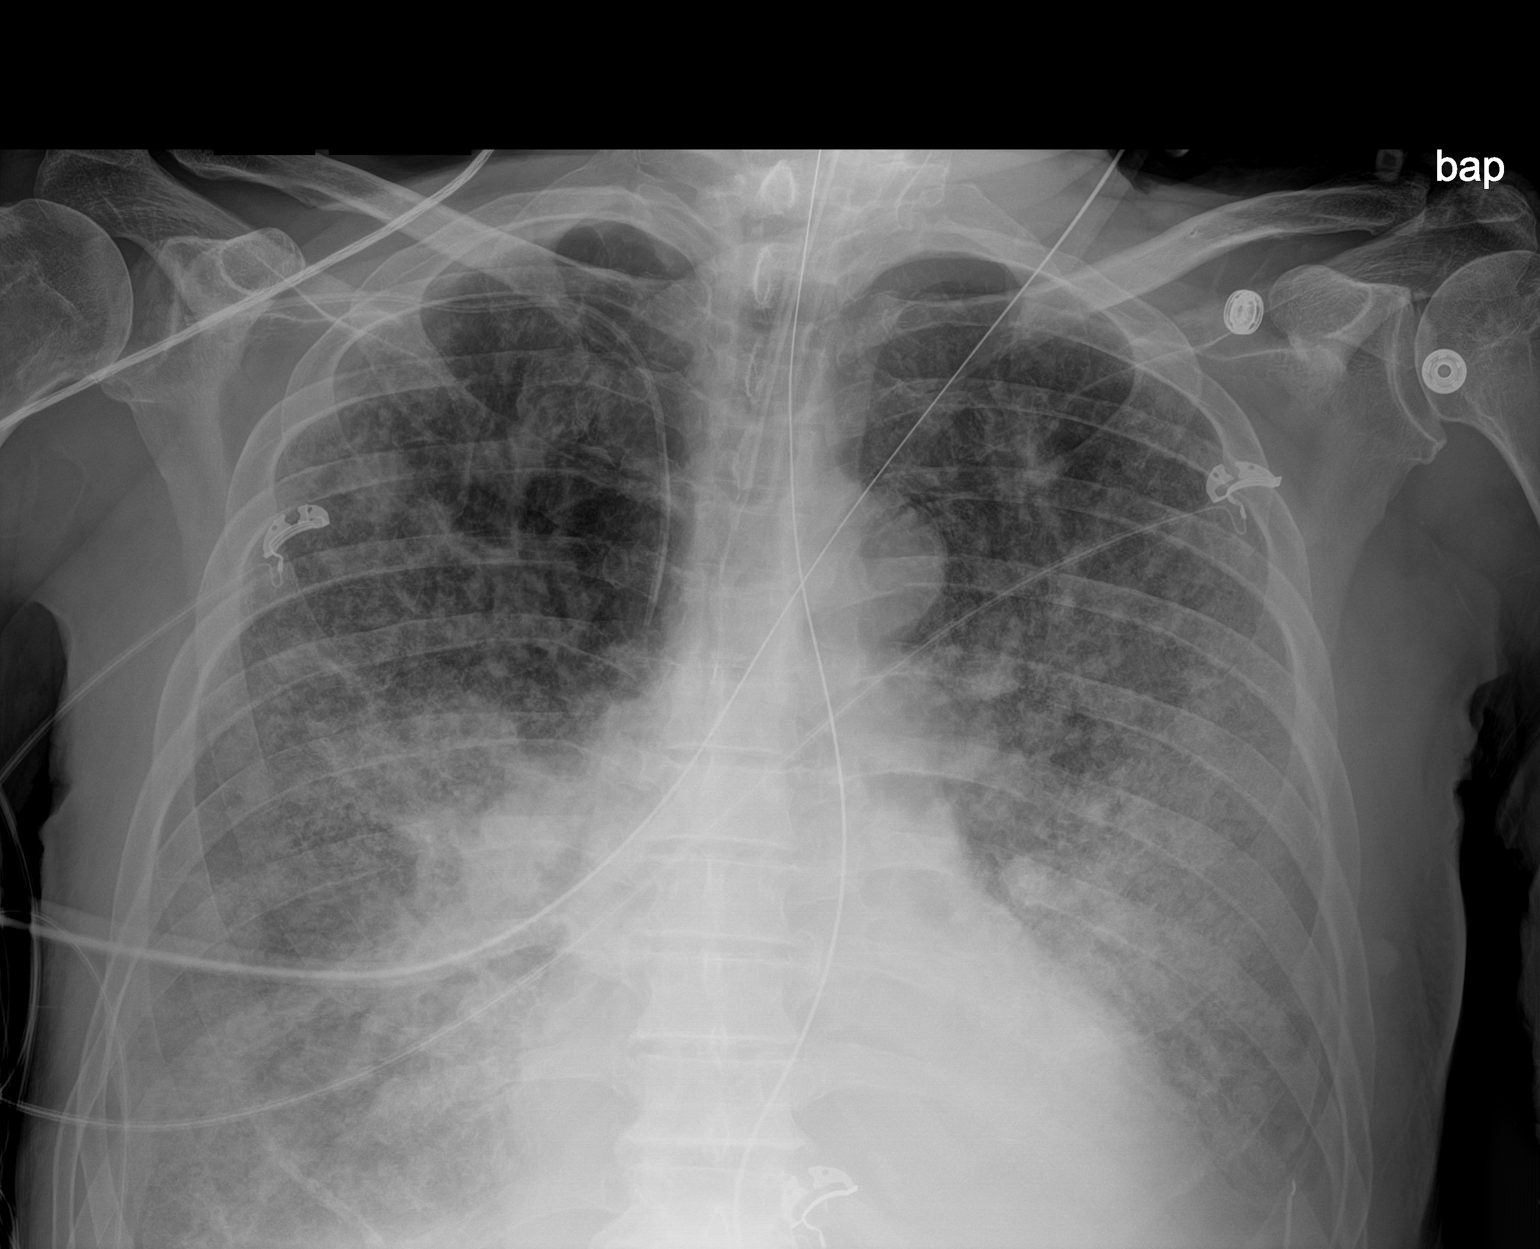

[1 of 1 positions shown; findings below may reference images not displayed]

FINDINGS: Extensive interstitial and airspace opacity. Increased hazy density
at the bases which is incompletely covered.

Endotracheal tube tip is halfway between the clavicular heads and
carina. Right subclavian line with tip at the SVC. Enteric tube
which reaches the stomach. Stable heart size. No visible
pneumothorax.
IMPRESSION: 1. Continued confluent bilateral pneumonia.
2. Increased hazy density at the bases which may be pleural fluid.
3. Unremarkable hardware positioning.
